# Patient Record
Sex: Male | Born: 1970 | Race: White | Hispanic: No | Marital: Married | Smoking: Former smoker
Health system: Southern US, Community
[De-identification: ages and names within clinical notes are randomized; demographics above are authoritative.]

## PROBLEM LIST (undated history)

## (undated) DIAGNOSIS — G4733 Obstructive sleep apnea (adult) (pediatric): Secondary | ICD-10-CM

## (undated) DIAGNOSIS — T7840XA Allergy, unspecified, initial encounter: Secondary | ICD-10-CM

## (undated) DIAGNOSIS — K9041 Non-celiac gluten sensitivity: Secondary | ICD-10-CM

## (undated) DIAGNOSIS — F419 Anxiety disorder, unspecified: Secondary | ICD-10-CM

## (undated) DIAGNOSIS — I10 Essential (primary) hypertension: Secondary | ICD-10-CM

## (undated) DIAGNOSIS — E039 Hypothyroidism, unspecified: Secondary | ICD-10-CM

## (undated) DIAGNOSIS — F39 Unspecified mood [affective] disorder: Secondary | ICD-10-CM

## (undated) DIAGNOSIS — E079 Disorder of thyroid, unspecified: Secondary | ICD-10-CM

## (undated) DIAGNOSIS — M159 Polyosteoarthritis, unspecified: Secondary | ICD-10-CM

## (undated) DIAGNOSIS — G4730 Sleep apnea, unspecified: Secondary | ICD-10-CM

## (undated) HISTORY — DX: Unspecified mood (affective) disorder: F39

## (undated) HISTORY — DX: Polyosteoarthritis, unspecified: M15.9

## (undated) HISTORY — DX: Hypothyroidism, unspecified: E03.9

## (undated) HISTORY — PX: EYE SURGERY: SHX253

## (undated) HISTORY — DX: Essential (primary) hypertension: I10

## (undated) HISTORY — DX: Anxiety disorder, unspecified: F41.9

## (undated) HISTORY — DX: Obstructive sleep apnea (adult) (pediatric): G47.33

## (undated) HISTORY — DX: Non-celiac gluten sensitivity: K90.41

## (undated) HISTORY — PX: NO PAST SURGERIES: SHX2092

## (undated) HISTORY — DX: Allergy, unspecified, initial encounter: T78.40XA

## (undated) HISTORY — DX: Sleep apnea, unspecified: G47.30

---

## 2011-05-04 ENCOUNTER — Encounter (HOSPITAL_COMMUNITY): Payer: Self-pay | Admitting: *Deleted

## 2011-05-04 ENCOUNTER — Emergency Department (HOSPITAL_COMMUNITY)
Admission: EM | Admit: 2011-05-04 | Discharge: 2011-05-04 | Disposition: A | Discharge status: Home / Self Care | Payer: Managed Care, Other (non HMO) | Attending: Emergency Medicine | Admitting: Emergency Medicine

## 2011-05-04 ENCOUNTER — Emergency Department (HOSPITAL_COMMUNITY): Payer: Managed Care, Other (non HMO)

## 2011-05-04 DIAGNOSIS — F17200 Nicotine dependence, unspecified, uncomplicated: Secondary | ICD-10-CM | POA: Insufficient documentation

## 2011-05-04 DIAGNOSIS — R209 Unspecified disturbances of skin sensation: Secondary | ICD-10-CM | POA: Insufficient documentation

## 2011-05-04 DIAGNOSIS — W3189XA Contact with other specified machinery, initial encounter: Secondary | ICD-10-CM | POA: Insufficient documentation

## 2011-05-04 DIAGNOSIS — S61209A Unspecified open wound of unspecified finger without damage to nail, initial encounter: Secondary | ICD-10-CM | POA: Insufficient documentation

## 2011-05-04 DIAGNOSIS — S62639B Displaced fracture of distal phalanx of unspecified finger, initial encounter for open fracture: Secondary | ICD-10-CM | POA: Insufficient documentation

## 2011-05-04 DIAGNOSIS — IMO0002 Reserved for concepts with insufficient information to code with codable children: Secondary | ICD-10-CM

## 2011-05-04 MED ORDER — TETANUS-DIPHTH-ACELL PERTUSSIS 5-2.5-18.5 LF-MCG/0.5 IM SUSP
0.5000 mL | Freq: Once | INTRAMUSCULAR | Status: DC
  Filled 2011-05-04: qty 0.5

## 2011-05-04 MED ORDER — OXYCODONE-ACETAMINOPHEN 5-325 MG PO TABS
1.0000 | ORAL_TABLET | Freq: Once | ORAL | Status: AC
  Administered 2011-05-04: 1 via ORAL

## 2011-05-04 MED ORDER — OXYCODONE-ACETAMINOPHEN 5-325 MG PO TABS
ORAL_TABLET | ORAL | Status: AC
  Filled 2011-05-04: qty 1

## 2011-05-04 MED ORDER — OXYCODONE HCL 5 MG PO CAPS: 10.0000 mg | ORAL_CAPSULE | ORAL | Status: AC | PRN

## 2011-05-04 MED ORDER — CEPHALEXIN 500 MG PO CAPS: 500.0000 mg | ORAL_CAPSULE | Freq: Four times a day (QID) | ORAL | Status: AC

## 2011-05-04 NOTE — Consult Note (Signed)
  Dict # [id number]  See Op Note  Dominica Severin MD

## 2011-05-04 NOTE — ED Notes (Signed)
The pt caught rt index finger in a wood splitter just pta.  The pt has a laceration to the base of his finger nail. And the fingernail base is outside the skin .  Bleeding is controlled

## 2011-05-04 NOTE — ED Provider Notes (Signed)
History     CSN: [id number]  Arrival date & time 05/04/11  1821   First MD Initiated Contact with Patient 05/04/11 2126      Chief Complaint  Patient presents with  . Laceration    (Consider location/radiation/quality/duration/timing/severity/associated sxs/prior treatment) HPI History from patient. 41 year old male presents with a laceration to his right index finger. He was cutting some wood in a long splitter, when his glove became caught and his hand went forward toward the machine. He was immediately able to turn the machine off. He did sustain a laceration horizontally across his distal phalanx of his index finger with this. He denies any numbness, weakness, but does notice some slight tingling. Denies inability to move the finger.  History reviewed. No pertinent past medical history.  History reviewed. No pertinent past surgical history.  No family history on file.  History  Substance Use Topics  . Smoking status: Current Everyday Smoker  . Smokeless tobacco: Not on file  . Alcohol Use: Yes      Review of Systems  Constitutional: Negative.   Musculoskeletal: Positive for arthralgias. Negative for joint swelling.  Skin: Positive for wound.  Neurological: Negative for weakness and numbness.    Allergies  Sulfa antibiotics  Home Medications   Current Outpatient Rx  Name Route Sig Dispense Refill  . CETIRIZINE HCL 10 MG PO TABS Oral Take 10 mg by mouth daily.    Marland Kitchen FLUCONAZOLE 200 MG PO TABS Oral Take 200 mg by mouth every 7 (seven) days. 1 tab weekly on Mondays for 6 months; Start date 04/25/11    . LEVOTHYROXINE SODIUM 125 MCG PO TABS Oral Take 125 mcg by mouth daily.      BP 128/83  Pulse 88  Temp(Src) 98.1 F (36.7 C) (Oral)  Resp 18  SpO2 98%  Physical Exam  Nursing note and vitals reviewed. Constitutional: He appears well-developed and well-nourished. No distress.  HENT:  Head: Normocephalic and atraumatic.  Neck: Normal range of motion.    Cardiovascular: Normal rate.   Pulmonary/Chest: Effort normal.  Musculoskeletal: Normal range of motion.       Horizontal laceration at the eponychium, measuring approximately 1 to 1.5 centimeters in length, extending outward from the sides of the nail bilaterally. There is no injury to the pad of the finger. The nail is not notably loose, but there is a large subungual hematoma. Neurovascularly intact with sensory intact to light touch. Patient is able to flex and extend at the isolated DIP.  Neurological: He is alert.  Skin: Skin is warm and dry. He is not diaphoretic.  Psychiatric: He has a normal mood and affect.    ED Course  Procedures (including critical care time)  Labs Reviewed - No data to display Dg Finger Index Right  05/04/2011  *RADIOLOGY REPORT*  Clinical Data: Finger laceration  RIGHT INDEX FINGER 2+V  Comparison: None  Findings: There is a fracture deformity involving the tuft of the distal phalanx.  There is distal and volar displacement of the distal fracture fragment.  There is diffuse soft tissue swelling.  IMPRESSION:  1.  Fracture involves the tuft of the distal phalanx. 2.  Volar and distal displacement of the distal fracture fragments.  Original Report Authenticated By: Rosealee Albee, M.D.     1. Open fracture of distal phalangeal tuft       MDM  Case d/w Dr. Amanda Pea, on call for hand surgery, as the patient has a laceration with a tuft fx, which is  considered an open fx. He graciously agreed to repair the patient's injury in the ED. Pt tolerated well. He is to follow up with Dr. Amanda Pea as instructed. He was discharged with oxycodone and Keflex prescriptions. Return precautions discussed.        Grant Fontana, [place] 05/05/11 [phone]

## 2011-05-04 NOTE — Discharge Instructions (Signed)
We recommend that you to take vitamin C 1000 mg a day to promote healing we also recommend that if you require her pain medicine that he take a stool softener to prevent constipation as most pain medicines will have constipation side effects. We recommend either Peri-Colace or Senokot and recommend that you also consider adding MiraLAX to prevent the constipation affects from pain medicine if you are required to use them. These medicines are over the counter and maybe purchased at a local pharmacy. ° ° ° °Keep bandage clean and dry.  Call for any problems.  No smoking.  Criteria for driving a car: you should be off your pain medicine for 7-8 hours, able to drive one handed(confident), thinking clearly and feeling able in your judgement to drive. °Continue elevation as it will decrease swelling.  If instructed by MD move your fingers within the confines of the bandage/splint.  Use ice if instructed by your MD. Call immediately for any sudden loss of feeling in your hand/arm or change in functional abilities of the extremity. ° °

## 2011-05-04 NOTE — Consult Note (Signed)
Reason for Consult open right index fracture Referring Physician: ED staff  Gregory [name] is an 41 y.o. male.  HPI: Marland KitchenMarland KitchenPatient presents for evaluation and treatment of the of their upper extremity predicament. The patient denies neck back chest or of abdominal pain. The patient notes that they have no lower extremity problems. The patient from primarily complains of the upper extremity pain noted.  Patient presents for right index reconstruction after wood spliting injury  History reviewed. No pertinent past medical history.  History reviewed. No pertinent past surgical history.  No family history on file.  Social History:  reports that he has been smoking.  He does not have any smokeless tobacco history on file. He reports that he drinks alcohol. His drug history not on file.  Allergies:  Allergies  Allergen Reactions  . Sulfa Antibiotics     Medications: I have reviewed the patient's current medications.  No results found for this or any previous visit (from the past 48 hour(s)).  Dg Finger Index Right  05/04/2011  *RADIOLOGY REPORT*  Clinical Data: Finger laceration  RIGHT INDEX FINGER 2+V  Comparison: None  Findings: There is a fracture deformity involving the tuft of the distal phalanx.  There is distal and volar displacement of the distal fracture fragment.  There is diffuse soft tissue swelling.  IMPRESSION:  1.  Fracture involves the tuft of the distal phalanx. 2.  Volar and distal displacement of the distal fracture fragments.  Original Report Authenticated By: Rosealee Albee, M.D.    Review of Systems  Constitutional: Negative.   HENT: Negative.   Eyes: Negative.   Respiratory: Negative.   Cardiovascular: Negative.   Gastrointestinal: Negative.   Genitourinary: Negative.   Musculoskeletal:       See operative note re: index finger right hand  Skin: Negative.   Neurological: Negative.   Endo/Heme/Allergies: Negative.   Psychiatric/Behavioral: Negative.     Blood pressure 128/83, pulse 88, temperature 98.1 F (36.7 C), temperature source Oral, resp. rate 18, SpO2 98.00%. Physical Exam..The patient is alert and oriented in no acute distress the patient complains of pain in the affected upper extremity. The patient is noted to have a normal HEENT exam. Lung fields show equal chest expansion and no shortness of breath abdomen exam is nontender without distention. Lower extremity examination does not show any fracture dislocation or blood clot symptoms. Pelvis is stable neck and back are stable and nontender  Open fracture of the right index finger with nailbed injury and laceration  Assessment/Plan: Marland KitchenMarland KitchenWe are planning surgery for your upper extremity. The risk and benefits of surgery include risk of bleeding infection anesthesia damage to normal structures and failure of the surgery to accomplish its intended goals of relieving symptoms and restoring function with this in mind we'll going to proceed. I have specifically discussed with the patient the pre-and postoperative regime and the does and don'ts and risk and benefits in great detail. Risk and benefits of surgery also include risk of dystrophy chronic nerve pain failure of the healing process to go onto completion and other inherent risks of surgery The relavent the pathophysiology of the disease/injury process, as well as the alternatives for treatment and postoperative course of action has been discussed in great detail with the patient who desires to proceed.  We will do everything in our power to help you (the patient) restore function to the upper extremity. Is a pleasure to see this patient today.   Karen Chafe 05/04/2011, 11:04 PM

## 2011-05-05 NOTE — Op Note (Signed)
NAME:  Gregory [name], Gregory [name] NO.:  [id number]  MEDICAL RECORD NO.:  [id number]  LOCATION:  [id number]                        FACILITY:  [facility]  PHYSICIAN:  Dionne Ano. Telecia Larocque, M.D.DATE OF BIRTH:  05-10-1970  DATE OF PROCEDURE: DATE OF DISCHARGE:                              OPERATIVE REPORT   PREOPERATIVE DIAGNOSIS:  Open fracture, right index finger with nail plate and nail bed, distal ray.  POSTOPERATIVE DIAGNOSIS:  Open fracture, right index finger with nail plate and nail bed, distal ray.  PROCEDURES: 1. Irrigation and debridement, open fracture.  This was an excisional     debridement of skin, subcutaneous tissue, bone, and nail bed and     nail plate. 2. Nail plate removal. 3. Open treatment of distal phalanx fracture. 4. Complex nail bed laceration repair including germinal and sterile     matrix.  SURGEON:  Dionne Ano. Amanda Pea, MD  ASSISTANT:  None.  COMPLICATIONS:  None.  INDICATIONS FOR THE PROCEDURE:  The patient presents after wood splitting injury with an open fracture of the right index finger.  He understands the risks and benefits of surgery and desires to proceed.  PROCEDURE:  The patient was seen by myself.  He was given intermetacarpal block.  Prepped and draped in usual sterile fashion with Hibiclens washing followed by 2 separate Betadine scrubs.  Once this was done, sterile field was secured, and the patient had the nail plate removed.  Nail plate removal was accomplished without difficulty.  Following this, 2 L of saline were placed in a pulsatile fashion through the open fracture.  This was an irrigation and debridement of an open fracture, excisional in nature including skin, subcutaneous tissue, bone, and nail bed tissue.  Following I and D, the patient then underwent very careful and cautious look at the fracture.  The patient underwent a setting and open treatment of the distal phalanx fracture.  Once distal phalanx fracture was  treated with open setting technique, the patient then underwent a very careful and cautious nail bed repair utilizing 4.0 loupe magnification and 6-0 chromic.  The patient had the sterile and germinal matrix repaired without difficulty.  He tolerated this well.  Once this was done, tourniquet was deflated.  Refill was excellent.  Adaptic was placed under the eponychial fold to prevent nail bed adherence, and the patient then had sterile noncompressive bandage place followed by finger splint.  He was instructed on elevation, antibiotics in the form of Keflex, pain medicine in the form of oxycodone, vitamin C, Peri-Colace, and our standard postop regime.  We will see him in a week in therapy for finger splint, and following this, I will see him in 2 weeks in my office for followup exam and treatment.  Should any problems occur,  he will notify me.  Do's and do not's have been discussed and all questions encouraged and answered.     Dionne Ano. Amanda Pea, M.D.     [facility]  D:  05/04/2011  T:  05/05/2011  Job:  [id number]

## 2011-05-07 NOTE — ED Provider Notes (Signed)
Medical screening examination/treatment/procedure(s) were performed by non-physician practitioner and as supervising physician I was immediately available for consultation/collaboration.  Leeroy Lovings, MD 05/07/11 1556 

## 2016-02-10 LAB — HM COLONOSCOPY

## 2017-02-25 ENCOUNTER — Other Ambulatory Visit: Payer: Self-pay

## 2017-02-25 ENCOUNTER — Encounter: Payer: Self-pay | Admitting: Emergency Medicine

## 2017-02-25 ENCOUNTER — Emergency Department
Admission: EM | Admit: 2017-02-25 | Discharge: 2017-02-26 | Disposition: A | Discharge status: Home / Self Care | Payer: 59 | Attending: Emergency Medicine | Admitting: Emergency Medicine

## 2017-02-25 DIAGNOSIS — J039 Acute tonsillitis, unspecified: Secondary | ICD-10-CM | POA: Diagnosis not present

## 2017-02-25 DIAGNOSIS — Z79899 Other long term (current) drug therapy: Secondary | ICD-10-CM | POA: Insufficient documentation

## 2017-02-25 DIAGNOSIS — R070 Pain in throat: Secondary | ICD-10-CM | POA: Diagnosis present

## 2017-02-25 HISTORY — DX: Disorder of thyroid, unspecified: E07.9

## 2017-02-25 MED ORDER — SODIUM CHLORIDE 0.9 % IV BOLUS (SEPSIS)
1000.0000 mL | INTRAVENOUS | Status: AC
  Administered 2017-02-25: 1000 mL via INTRAVENOUS

## 2017-02-25 NOTE — ED Notes (Signed)
Pt dx with strep today at urgent care .  Pt has had 2 doses today of pcn.  States not feeling any better.  Pt spitting up phlegm.  No resp distress.   Decreased appetite.  Pt alert.

## 2017-02-25 NOTE — ED Notes (Signed)
ED Provider at bedside. 

## 2017-02-25 NOTE — ED Triage Notes (Addendum)
Pt presents to ED with severe sore throat. Unable to swallow his secretions. Dx with strep today at urgent care. Wife states pt voice sounds more muffled than normal.  Pt states earlier this evening around 2100 he coughed up a silver dollar size amount of blood. Has been unable to eat in two days.

## 2017-02-26 ENCOUNTER — Emergency Department: Payer: 59

## 2017-02-26 ENCOUNTER — Encounter: Payer: Self-pay | Admitting: Radiology

## 2017-02-26 LAB — BASIC METABOLIC PANEL
Anion gap: 11 (ref 5–15)
BUN: 15 mg/dL (ref 6–20)
CALCIUM: 8.8 mg/dL — AB (ref 8.9–10.3)
CO2: 25 mmol/L (ref 22–32)
CREATININE: 0.91 mg/dL (ref 0.61–1.24)
Chloride: 104 mmol/L (ref 101–111)
GFR calc non Af Amer: >60 mL/min (ref >60)
Glucose, Bld: 103 mg/dL — ABNORMAL HIGH (ref 65–99)
Potassium: 4 mmol/L (ref 3.5–5.1)
Sodium: 140 mmol/L (ref 135–145)

## 2017-02-26 LAB — CBC WITH DIFFERENTIAL/PLATELET
Basophils Absolute: 0 10*3/uL (ref 0–0.1)
Basophils Relative: 0 %
EOS ABS: 0 10*3/uL (ref 0–0.7)
Eosinophils Relative: 0 %
HCT: 46.4 % (ref 40.0–52.0)
Hemoglobin: 15.9 g/dL (ref 13.0–18.0)
Lymphocytes Relative: 11 %
Lymphs Abs: 1.6 10*3/uL (ref 1.0–3.6)
MCH: 31.4 pg (ref 26.0–34.0)
MCHC: 34.2 g/dL (ref 32.0–36.0)
MCV: 91.7 fL (ref 80.0–100.0)
MONO ABS: 1.2 10*3/uL — AB (ref 0.2–1.0)
MONOS PCT: 8 %
NEUTROS ABS: 11.7 10*3/uL — AB (ref 1.4–6.5)
Neutrophils Relative %: 81 %
Platelets: 238 10*3/uL (ref 150–440)
RBC: 5.06 MIL/uL (ref 4.40–5.90)
RDW: 13 % (ref 11.5–14.5)
WBC: 14.6 10*3/uL — ABNORMAL HIGH (ref 3.8–10.6)

## 2017-02-26 MED ORDER — SODIUM CHLORIDE 0.9 % IV SOLN
3.0000 g | INTRAVENOUS | Status: AC
  Administered 2017-02-26: 3 g via INTRAVENOUS
  Filled 2017-02-26: qty 3

## 2017-02-26 MED ORDER — IOPAMIDOL (ISOVUE-300) INJECTION 61%
75.0000 mL | Freq: Once | INTRAVENOUS | Status: AC | PRN
  Administered 2017-02-26: 75 mL via INTRAVENOUS

## 2017-02-26 MED ORDER — DEXAMETHASONE SODIUM PHOSPHATE 10 MG/ML IJ SOLN
10.0000 mg | Freq: Once | INTRAMUSCULAR | Status: AC
  Administered 2017-02-26: 10 mg via INTRAVENOUS
  Filled 2017-02-26: qty 1

## 2017-02-26 NOTE — ED Notes (Signed)
ED Provider at bedside. 

## 2017-02-26 NOTE — ED Provider Notes (Signed)
[facility] Emergency Department Provider Note  ____________________________________________   First MD Initiated Contact with Patient 02/25/17 2335     (approximate)  I have reviewed the triage vital signs and the nursing notes.   HISTORY  Chief Complaint Sore Throat    HPI Gregory [name] is a 47 y.o. male with no contributory past medical history who presents for evaluation of gradually worsening severe sore throat over the last several days.  It is gotten to the point that it is very difficult to swallow anything and his wife feels like his voice is hoarse.  Due to the pain he has been spitting out his saliva and he feels like there is some swelling in the left side of his throat.  He does not have any dental problems at this time.  He was diagnosed with strep throat at urgent care earlier today and has had 2 doses of penicillin but he is not feeling any better.  He is not having any respiratory distress but he has had decreased appetite.  Denies fever/chills, nausea, vomiting, abdominal pain, and dysuria.  He describes the symptoms as severe trying to eat or drink anything makes it worse, nothing makes it better.  He did cough or spit up a small amount of blood today.  Past Medical History:  Diagnosis Date  . Thyroid disease     There are no active problems to display for this patient.   History reviewed. No pertinent surgical history.  Prior to Admission medications   Medication Sig Start Date End Date Taking? Authorizing Provider  cetirizine (ZYRTEC) 10 MG tablet Take 10 mg by mouth daily.    [provider]  levothyroxine (SYNTHROID, LEVOTHROID) 125 MCG tablet Take 125 mcg by mouth daily.    [provider]  penicillin v potassium (VEETID) 500 MG tablet Take 500 mg by mouth 2 (two) times daily. 02/25/17   [provider]    Allergies Sulfasalazine and Sulfa antibiotics  No family history on file.  Social  History Social History   Tobacco Use  . Smoking status: Never Smoker  . Smokeless tobacco: Never Used  Substance Use Topics  . Alcohol use: Yes  . Drug use: No    Review of Systems Constitutional: No fever/chills Eyes: No visual changes. ENT: Severe sore throat with a sensation of swelling and difficulty swallowing Cardiovascular: Denies chest pain. Respiratory: Denies shortness of breath. Gastrointestinal: No abdominal pain.  No nausea, no vomiting.  No diarrhea.  No constipation. Genitourinary: Negative for dysuria. Musculoskeletal: Negative for neck pain.  Negative for back pain. Integumentary: Negative for rash. Neurological: Negative for headaches, focal weakness or numbness.   ____________________________________________   PHYSICAL EXAM:  VITAL SIGNS: ED Triage Vitals  Enc Vitals Group     BP 02/25/17 2304 (!) 139/95     Pulse Rate 02/25/17 2304 75     Resp 02/25/17 2304 20     Temp 02/25/17 2304 98.6 F (37 C)     Temp Source 02/25/17 2304 Oral     SpO2 02/25/17 2304 97 %     Weight 02/25/17 2305 98 kg (216 lb)     Height 02/25/17 2305 1.803 m (5\' 11" )     Head Circumference --      Peak Flow --      Pain Score 02/25/17 2305 8     Pain Loc --      Pain Edu? --      Excl. in GC? --  Constitutional: Alert and oriented.  Nontoxic but does appear uncomfortable  eyes: Conjunctivae are normal.  Head: Atraumatic. Nose: No congestion/rhinnorhea. Mouth/Throat: Mucous membranes are moist.  Oropharynx images with some petechiae on the palate but no exudate and no obvious swelling.  No evidence of Ludwig's angina, no obvious sign of peritonsillar abscess, no evidence of dental abscesses or swelling. Neck: No stridor.  No meningeal signs.  No submandibular lymphadenopathy or induration and no palpable cervical lymphadenopathy Cardiovascular: Normal rate, regular rhythm. Good peripheral circulation. Grossly normal heart sounds. Respiratory: Normal respiratory  effort.  No retractions. Lungs CTAB. Gastrointestinal: Soft and nontender. No distention.  Musculoskeletal: No lower extremity tenderness nor edema. No gross deformities of extremities. Neurologic:  Normal speech and language. No gross focal neurologic deficits are appreciated.  Skin:  Skin is warm, dry and intact. No rash noted. Psychiatric: Mood and affect are normal. Speech and behavior are normal.  ____________________________________________   LABS (all labs ordered are listed, but only abnormal results are displayed)  Labs Reviewed  CBC WITH DIFFERENTIAL/PLATELET - Abnormal; Notable for the following components:      Result Value   WBC 14.6 (*)    Neutro Abs 11.7 (*)    Monocytes Absolute 1.2 (*)    All other components within normal limits  BASIC METABOLIC PANEL - Abnormal; Notable for the following components:   Glucose, Bld 103 (*)    Calcium 8.8 (*)    All other components within normal limits   ____________________________________________  EKG  None - EKG not ordered by ED physician ____________________________________________  RADIOLOGY   ED MD interpretation: No evidence of acute abscess or neck soft tissue infection other than tonsillitis  Official radiology report(s): Ct Soft Tissue Neck W Contrast  Result Date: 02/26/2017 CLINICAL DATA:  Sore throat and stridor EXAM: CT NECK WITH CONTRAST TECHNIQUE: Multidetector CT imaging of the neck was performed using the standard protocol following the bolus administration of intravenous contrast. CONTRAST:  75mL ISOVUE-300 IOPAMIDOL (ISOVUE-300) INJECTION 61% COMPARISON:  None. FINDINGS: Pharynx and larynx: --Nasopharynx: Fossae of Rosenmuller are clear. Normal adenoid tonsils for age. --Oral cavity and oropharynx: Marked enlargement of the palatine and lingual tonsils. No peritonsillar fluid collection. Oral cavity and base of tongue are normal. --Hypopharynx: Normal vallecula and pyriform sinuses. --Larynx: Normal  epiglottis and pre-epiglottic space. Normal aryepiglottic and vocal folds. --Retropharyngeal space: No abscess, effusion or lymphadenopathy. Salivary glands: --Parotid: No mass lesion or inflammation. No sialolithiasis or ductal dilatation. --Submandibular: Symmetric without inflammation. No sialolithiasis or ductal dilatation. --Sublingual: Normal. No ranula or other visible lesion of the base of tongue and floor of mouth. Thyroid: Normal. Lymph nodes: 12 mm left level 2 A lymph node. 11 mm right level 2 A node. No abnormal density nodes. Vascular: Major cervical vessels are patent. Limited intracranial: Normal. Visualized orbits: Normal. Mastoids and visualized paranasal sinuses: Right maxillary sinus fluid level with mucosal thickening. No mastoid or middle ear effusion. Skeleton: No bony spinal canal stenosis. No lytic or blastic lesions. Upper chest: Clear. Other: None. IMPRESSION: 1. Enlarged palatine and lingual tonsils, consistent with acute tonsillopharyngitis. No peritonsillar or retropharyngeal abscess or fluid collection. 2. Bilateral reactive level 2 A cervical lymphadenopathy. 3. Normal epiglottis.  No airway compromise. Electronically Signed   By: Deatra RobinsonKevin  Herman M.D.   On: 02/26/2017 00:58    ____________________________________________   PROCEDURES  Critical Care performed: No   Procedure(s) performed:   Procedures   ____________________________________________   INITIAL IMPRESSION / ASSESSMENT AND PLAN / ED COURSE  As part of my medical decision making, I reviewed the following data within the electronic MEDICAL RECORD NUMBER Nursing notes reviewed and incorporated and Labs reviewed     Differential diagnosis includes, but is not limited to, tonsillitis/pharyngitis, peritonsillar abscess, retropharyngeal abscess, epiglottitis, suppurative adenitis.  Given the patient's description of symptoms I will proceed with a CT scan of his neck given that he has no obvious peritonsillar  abscess and I am concerned about a deep tissue infection.  I will give Unasyn 3 g IV and Decadron 10 mg IV to start and then I will reassess after the imaging.  He is protecting his airway well at this time and does not look systemically ill.  Clinical Course as of Feb 26 329  Tue Feb 26, 2017  0154 I updated the patient regarding the reassuring CT scan results; he has tonsillitis/pharyngitis but no evidence of fluid collection or abscess.  There is no laryngeal/airway edema.  He is now tolerating p.o. intake.  Will discharge on his penicilline regimen.  He received Unasyn and Decadron in the ED, which should help.  [CF]    Clinical Course User Index [CF] Loleta Rose, MD    ____________________________________________  FINAL CLINICAL IMPRESSION(S) / ED DIAGNOSES  Final diagnoses:  Tonsillitis     MEDICATIONS GIVEN DURING THIS VISIT:  Medications  sodium chloride 0.9 % bolus 1,000 mL (0 mLs Intravenous Stopped 02/26/17 0201)  iopamidol (ISOVUE-300) 61 % injection 75 mL (75 mLs Intravenous Contrast Given 02/26/17 0032)  Ampicillin-Sulbactam (UNASYN) 3 g in sodium chloride 0.9 % 100 mL IVPB (0 g Intravenous Stopped 02/26/17 0201)  dexamethasone (DECADRON) injection 10 mg (10 mg Intravenous Given 02/26/17 0042)     ED Discharge Orders    None       Note:  This document was prepared using Dragon voice recognition software and may include unintentional dictation errors.    Loleta Rose, MD 02/26/17 [phone]

## 2017-02-26 NOTE — Discharge Instructions (Signed)
Please continue to take your penicillin twice daily for the full 10-day course.  Follow-up with your doctor or with the ENT specialist as needed.  Return to the emergency department if you develop new or worsening symptoms that concern you.

## 2017-04-15 ENCOUNTER — Encounter (HOSPITAL_BASED_OUTPATIENT_CLINIC_OR_DEPARTMENT_OTHER): Payer: Self-pay

## 2017-04-15 DIAGNOSIS — R0683 Snoring: Secondary | ICD-10-CM

## 2017-05-09 ENCOUNTER — Ambulatory Visit (HOSPITAL_BASED_OUTPATIENT_CLINIC_OR_DEPARTMENT_OTHER): Payer: Managed Care, Other (non HMO) | Attending: Otolaryngology | Admitting: Internal Medicine

## 2017-05-09 DIAGNOSIS — G4733 Obstructive sleep apnea (adult) (pediatric): Secondary | ICD-10-CM | POA: Diagnosis not present

## 2017-05-09 DIAGNOSIS — R0683 Snoring: Secondary | ICD-10-CM | POA: Diagnosis present

## 2017-05-19 DIAGNOSIS — R0683 Snoring: Secondary | ICD-10-CM | POA: Diagnosis not present

## 2017-05-19 NOTE — Procedures (Signed)
Patient Name: Gregory [name], Gregory [name] Date: 05/09/2017 Gender: Male D.O.B: 1970/05/23 Age (years): 46 Referring Provider: Izora Gala Height (inches): 71 Interpreting Physician: Baird Lyons MD, ABSM Weight (lbs): 215 RPSGT: Lanae Boast BMI: 30 MRN: [id number] Neck Size: 16.50  CLINICAL INFORMATION Sleep Study Type: Split Night CPAP Indication for sleep study: Snoring  Epworth Sleepiness Score: 7  SLEEP STUDY TECHNIQUE As per the AASM Manual for the Scoring of Sleep and Associated Events v2.3 (April 2016) with a hypopnea requiring 4% desaturations.  The channels recorded and monitored were frontal, central and occipital EEG, electrooculogram (EOG), submentalis EMG (chin), nasal and oral airflow, thoracic and abdominal wall motion, anterior tibialis EMG, snore microphone, electrocardiogram, and pulse oximetry. Continuous positive airway pressure (CPAP) was initiated when the patient met split night criteria and was titrated according to treat sleep-disordered breathing.  MEDICATIONS Medications self-administered by patient taken the night of the study : AMBIEN  RESPIRATORY PARAMETERS Diagnostic  Total AHI (/hr): 18.8 RDI (/hr): 22.7 OA Index (/hr): - CA Index (/hr): 0.0 REM AHI (/hr): 56.3 NREM AHI (/hr): 14.9 Supine AHI (/hr): 25.0 Non-supine AHI (/hr): 7.18 Min O2 Sat (%): 84.0 Mean O2 (%): 93.9 Time below 88% (min): 2.5   Titration  Optimal Pressure (cm): 9 AHI at Optimal Pressure (/hr): 0.0 Min O2 at Optimal Pressure (%): 93.0 Supine % at Optimal (%): 81 Sleep % at Optimal (%): 91   SLEEP ARCHITECTURE The recording time for the entire night was 390.9 minutes.  During a baseline period of 211.8 minutes, the patient slept for 169.0 minutes in REM and nonREM, yielding a sleep efficiency of 79.8%%. Sleep onset after lights out was 24.1 minutes with a REM latency of 139.5 minutes. The patient spent 16.0%% of the night in stage N1 sleep, 73.1%% in stage N2  sleep, 1.5%% in stage N3 and 9.5%% in REM.  During the titration period of 177.2 minutes, the patient slept for 170.8 minutes in REM and nonREM, yielding a sleep efficiency of 96.4%%. Sleep onset after CPAP initiation was 0.4 minutes with a REM latency of 61.5 minutes. The patient spent 4.3%% of the night in stage N1 sleep, 77.3%% in stage N2 sleep, 0.0%% in stage N3 and 18.4%% in REM.  CARDIAC DATA The 2 lead EKG demonstrated sinus rhythm. The mean heart rate was 100.0 beats per minute. Other EKG findings include: None.  LEG MOVEMENT DATA The total Periodic Limb Movements of Sleep (PLMS) were 0. The PLMS index was 0.0 .  IMPRESSIONS - Moderate obstructive sleep apnea occurred during the diagnostic portion of the study(AHI = 18.8/hour). An optimal PAP pressure was selected for this patient ( 9 cm of water) - No significant central sleep apnea occurred during the diagnostic portion of the study (CAI = 0.0/hour). - The patient had minimal or no oxygen desaturation during the diagnostic portion of the study (Min O2 = 84.0%) - The patient snored with loud snoring volume during the diagnostic portion of the study. - No cardiac abnormalities were noted during this study. - Clinically significant periodic limb movements did not occur during sleep.  DIAGNOSIS - Obstructive Sleep Apnea (327.23 [G47.33 ICD-10])  RECOMMENDATIONS - Trial of CPAP therapy on 9 cm H2O with a Medium size Resmed Full Face Mask AirFit F20 mask and heated humidification. - Sleep hygiene should be reviewed to assess factors that may improve sleep quality. - Weight management and regular exercise should be initiated or continued.  [Electronically signed] 05/19/2017 02:46 PM  Baird Lyons MD, ABSM Diplomate,  American Board of Sleep Medicine   NPI: [id number]                         [place]  ELECTRONICALLY SIGNED ON:  05/19/2017, 2:45 PM [place] PH: [phone]   FX: [phone] [place]

## 2018-10-29 ENCOUNTER — Encounter: Payer: Self-pay | Admitting: Internal Medicine

## 2018-10-29 ENCOUNTER — Other Ambulatory Visit: Payer: Self-pay

## 2018-10-29 ENCOUNTER — Ambulatory Visit (INDEPENDENT_AMBULATORY_CARE_PROVIDER_SITE_OTHER): Payer: 59 | Admitting: Internal Medicine

## 2018-10-29 VITALS — BP 122/86 | HR 73 | Temp 98.6°F | Ht 70.0 in | Wt 247.0 lb

## 2018-10-29 DIAGNOSIS — F39 Unspecified mood [affective] disorder: Secondary | ICD-10-CM | POA: Diagnosis not present

## 2018-10-29 DIAGNOSIS — E039 Hypothyroidism, unspecified: Secondary | ICD-10-CM | POA: Diagnosis not present

## 2018-10-29 DIAGNOSIS — M159 Polyosteoarthritis, unspecified: Secondary | ICD-10-CM | POA: Insufficient documentation

## 2018-10-29 DIAGNOSIS — I10 Essential (primary) hypertension: Secondary | ICD-10-CM | POA: Diagnosis not present

## 2018-10-29 DIAGNOSIS — Z0000 Encounter for general adult medical examination without abnormal findings: Secondary | ICD-10-CM | POA: Insufficient documentation

## 2018-10-29 DIAGNOSIS — Z23 Encounter for immunization: Secondary | ICD-10-CM

## 2018-10-29 DIAGNOSIS — G4733 Obstructive sleep apnea (adult) (pediatric): Secondary | ICD-10-CM | POA: Insufficient documentation

## 2018-10-29 DIAGNOSIS — M153 Secondary multiple arthritis: Secondary | ICD-10-CM

## 2018-10-29 MED ORDER — TRIAMTERENE-HCTZ 37.5-25 MG PO TABS: 1.0000 | ORAL_TABLET | Freq: Every day | ORAL | 3 refills | Status: DC

## 2018-10-29 MED ORDER — ESCITALOPRAM OXALATE 10 MG PO TABS: 10.0000 mg | ORAL_TABLET | Freq: Every day | ORAL | 3 refills | Status: DC

## 2018-10-29 MED ORDER — LEVOTHYROXINE SODIUM 125 MCG PO TABS: 125.0000 ug | ORAL_TABLET | Freq: Every day | ORAL | 3 refills | Status: DC

## 2018-10-29 NOTE — Patient Instructions (Signed)

## 2018-10-29 NOTE — Assessment & Plan Note (Signed)
Seems to be euthyroid Will check labs next time

## 2018-10-29 NOTE — Assessment & Plan Note (Signed)
He will work on fitness Flu vaccine today Will need colon again in 2023 No PSA for some years

## 2018-10-29 NOTE — Progress Notes (Signed)
Subjective:    Patient ID: Gregory [name], male    DOB: November 25, 1970, 48 y.o.   MRN: [id number]  HPI Here to establish care and for physical Had been going to his prior primary care doctor in [place]--but tired of the travel  Has had some BP problems Average on his wrist cuff is 144/86 He wants to be aggressive about this--would like to try medication  Started lexapro earlier this year Stress with work and family This has helped keep him more level---- not having anxiety attacks, etc No depression or dysthymia Not anhedonic No features of bipolar Uses xanax just for flying  Now 50# overweight but has been very fit in the past Limited by ankle injuries and knee pain (trouble walking) Has done cross fit in the past Does like bourbon--has increased intake as well (no alcohol dependence issues)  Uncle had colon cancer He already has had his first colonoscopy Concerned about gluten sensitivity---did lose weight when he went gluten free  Rx for hypothyroidism Goes back 10 years  Current Outpatient Medications on File Prior to Visit  Medication Sig Dispense Refill  . ALPRAZolam (XANAX) 0.25 MG tablet Take 0.25 mg by mouth 2 (two) times daily as needed.    Marland Kitchen escitalopram (LEXAPRO) 10 MG tablet Take 10 mg by mouth daily.    . fluticasone (FLONASE) 50 MCG/ACT nasal spray Place 2 sprays into both nostrils daily.    Marland Kitchen levothyroxine (SYNTHROID, LEVOTHROID) 125 MCG tablet Take 125 mcg by mouth daily.    . Melatonin 5 MG CHEW Chew by mouth.    . Probiotic Product (PROBIOTIC PO) Take by mouth.     No current facility-administered medications on file prior to visit.     Allergies  Allergen Reactions  . Sulfasalazine Anaphylaxis and Rash  . Gluten Meal   . Sulfa Antibiotics     Past Medical History:  Diagnosis Date  . Hypertension   . Hypothyroidism, adult   . Mood disorder ([place])   . Non-celiac gluten sensitivity   . Obstructive sleep apnea    Better with CPAP  .  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 NO PAST SURGERIES      History reviewed. No pertinent family history.  Social History   Socioeconomic History  . Marital status: Married    Spouse name: Not on file  . Number of children: 3  . Years of education: Not on file  . Highest education level: Not on file  Occupational History  . 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 Financial resource strain: Not on file  . Food insecurity    Worry: Not on file    Inability: Not on file  . Transportation needs    Medical: Not on file    Non-medical: Not on file  Tobacco Use  . Smoking status: Current Some Day Smoker    Types: Cigars  . Smokeless tobacco: Never Used  . Tobacco comment: 1 cigar a week or so  Substance and Sexual Activity  . Alcohol use: Yes  . Drug use: No  . Sexual activity: Not on file  Lifestyle  . Physical activity    Days per week: Not on file    Minutes per session: Not on file  . Stress: Not on file  Relationships  . Social connections    Talks on phone: Not on file  Gets together: Not on file    Attends religious service: Not on file    Active member of club or organization: Not on file    Attends meetings of clubs or organizations: Not on file    Relationship status: Not on file  . Intimate partner violence    Fear of current or ex partner: Not on file    Emotionally abused: Not on file    Physically abused: Not on file    Forced sexual activity: Not on file  Other Topics Concern  . Not on file  Social History Narrative  . Not on file   Review of Systems  Constitutional: Positive for unexpected weight change. Negative for fatigue.       Wears seat belt  HENT: Negative for dental problem and hearing loss.   Eyes: Negative for visual disturbance.       No diplopia or unilateral vision loss  Respiratory: Positive for chest  tightness. Negative for cough and shortness of breath.   Cardiovascular: Positive for palpitations.       Occ chest tightness when stressed---relates to "panic" type issues  Gastrointestinal:       Stools are generally soft or runny No blood  Endocrine: Negative for polydipsia and polyuria.  Genitourinary: Negative for difficulty urinating and urgency.  Musculoskeletal: Positive for arthralgias. Negative for joint swelling.  Allergic/Immunologic: Positive for environmental allergies. Negative for immunocompromised state.       Flonase helps congestion  Neurological: Negative for dizziness, syncope and light-headedness.  Psychiatric/Behavioral: Negative for dysphoric mood. The patient is nervous/anxious.        Sleeps okay with CPAP and CBD/melatonin (has weaned off ambien)       Objective:   Physical Exam  Constitutional: He is oriented to person, place, and time. He appears well-developed. No distress.  HENT:  Mouth/Throat: Oropharynx is clear and moist. No oropharyngeal exudate.  Eyes: Conjunctivae are normal.  Neck: No thyromegaly present.  Cardiovascular: Normal rate, regular rhythm, normal heart sounds and intact distal pulses. Exam reveals no gallop.  No murmur heard. Respiratory: Effort normal and breath sounds normal. No respiratory distress. He has no wheezes. He has no rales.  GI: Soft. There is no abdominal tenderness.  Musculoskeletal:        General: No tenderness or edema.  Lymphadenopathy:    He has no cervical adenopathy.  Neurological: He is alert and oriented to person, place, and time.  Skin: No rash noted. No erythema.  Psychiatric: He has a normal mood and affect. His behavior is normal.           Assessment & Plan:

## 2018-10-29 NOTE — Assessment & Plan Note (Signed)
Mostly anxiety and sleep problems

## 2018-10-29 NOTE — Assessment & Plan Note (Signed)
BP Readings from Last 3 Encounters:  10/29/18 122/86  02/25/17 140/90  05/04/11 135/89   He is concerned and wants to be proactive about the BP Will start HCTZ

## 2018-12-09 ENCOUNTER — Ambulatory Visit: Payer: 59 | Admitting: Internal Medicine

## 2018-12-19 ENCOUNTER — Ambulatory Visit: Payer: 59 | Admitting: Internal Medicine

## 2018-12-23 ENCOUNTER — Other Ambulatory Visit: Payer: Self-pay

## 2018-12-23 ENCOUNTER — Encounter: Payer: Self-pay | Admitting: Internal Medicine

## 2018-12-23 ENCOUNTER — Ambulatory Visit: Payer: 59 | Admitting: Internal Medicine

## 2018-12-23 DIAGNOSIS — I10 Essential (primary) hypertension: Secondary | ICD-10-CM

## 2018-12-23 LAB — CBC
HCT: 48.8 % (ref 39.0–52.0)
Hemoglobin: 17 g/dL (ref 13.0–17.0)
MCHC: 34.8 g/dL (ref 30.0–36.0)
MCV: 93.5 fl (ref 78.0–100.0)
Platelets: 267 10*3/uL (ref 150.0–400.0)
RBC: 5.22 Mil/uL (ref 4.22–5.81)
RDW: 12.5 % (ref 11.5–15.5)
WBC: 7.9 10*3/uL (ref 4.0–10.5)

## 2018-12-23 LAB — COMPREHENSIVE METABOLIC PANEL
ALT: 29 U/L (ref 0–53)
AST: 20 U/L (ref 0–37)
Albumin: 4.7 g/dL (ref 3.5–5.2)
Alkaline Phosphatase: 73 U/L (ref 39–117)
BUN: 16 mg/dL (ref 6–23)
CO2: 33 mEq/L — ABNORMAL HIGH (ref 19–32)
Calcium: 10 mg/dL (ref 8.4–10.5)
Chloride: 98 mEq/L (ref 96–112)
Creatinine, Ser: 1.14 mg/dL (ref 0.40–1.50)
GFR: 68.81 mL/min (ref >60.00)
Glucose, Bld: 109 mg/dL — ABNORMAL HIGH (ref 70–99)
Potassium: 4.4 mEq/L (ref 3.5–5.1)
Sodium: 137 mEq/L (ref 135–145)
Total Bilirubin: 0.8 mg/dL (ref 0.2–1.2)
Total Protein: 7.3 g/dL (ref 6.0–8.3)

## 2018-12-23 LAB — LIPID PANEL
Cholesterol: 205 mg/dL — ABNORMAL HIGH (ref 0–200)
HDL: 46.9 mg/dL (ref >39.00)
NonHDL: 158.08
Total CHOL/HDL Ratio: 4
Triglycerides: 254 mg/dL — ABNORMAL HIGH (ref 0.0–149.0)
VLDL: 50.8 mg/dL — ABNORMAL HIGH (ref 0.0–40.0)

## 2018-12-23 LAB — LDL CHOLESTEROL, DIRECT: Direct LDL: 116 mg/dL

## 2018-12-23 NOTE — Assessment & Plan Note (Signed)
BP Readings from Last 3 Encounters:  12/23/18 110/74  10/29/18 122/86  02/25/17 140/90   Doing well with the medication Will check renal function

## 2018-12-23 NOTE — Progress Notes (Signed)
Subjective:    Patient ID: Gregory [name], male    DOB: 1970-03-01, 48 y.o.   MRN: [id number]  HPI Here for follow up of HTN  This visit occurred during the SARS-CoV-2 public health emergency.  Safety protocols were in place, including screening questions prior to the visit, additional usage of staff PPE, and extensive cleaning of exam room while observing appropriate contact time as indicated for disinfecting solutions.   Has been taking the medication regularly No problems with this He feels "less of a pressure in my body" Describes it as "systemic fullness"---leading to short temper, frustration, etc Feels more calm, etc  Got arm cuff to check BP Hasn't been monitoring it lately  Current Outpatient Medications on File Prior to Visit  Medication Sig Dispense Refill  . ALPRAZolam (XANAX) 0.25 MG tablet Take 0.25 mg by mouth 2 (two) times daily as needed.    Marland Kitchen escitalopram (LEXAPRO) 10 MG tablet Take 1 tablet (10 mg total) by mouth daily. 90 tablet 3  . fluticasone (FLONASE) 50 MCG/ACT nasal spray Place 2 sprays into both nostrils daily.    Marland Kitchen levothyroxine (SYNTHROID) 125 MCG tablet Take 1 tablet (125 mcg total) by mouth daily. 90 tablet 3  . Melatonin 5 MG CHEW Chew by mouth.    . Probiotic Product (PROBIOTIC PO) Take by mouth.    . triamterene-hydrochlorothiazide (MAXZIDE-25) 37.5-25 MG tablet Take 1 tablet by mouth daily. 90 tablet 3   No current facility-administered medications on file prior to visit.    Allergies  Allergen Reactions  . Sulfasalazine Anaphylaxis and Rash  . Gluten Meal   . Sulfa Antibiotics     Past Medical History:  Diagnosis Date  . Hypertension   . Hypothyroidism, adult   . Mood disorder ([facility])   . Non-celiac gluten sensitivity   . Obstructive sleep apnea    Better with CPAP  . 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 NO PAST SURGERIES      Family History  Problem  Relation Age of Onset  . Heart disease Mother        valve repair  . Diabetes Father   . Obesity Father   . Colon cancer Paternal Uncle   . Diabetes Paternal Grandmother     Social History   Socioeconomic History  . Marital status: Married    Spouse name: Not on file  . Number of children: 3  . Years of education: Not on file  . Highest education level: Not on file  Occupational History  . 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  Tobacco Use  . Smoking status: Light Tobacco Smoker    Types: Cigars  . Smokeless tobacco: Never Used  . Tobacco comment: 1 every 1-2 months  Substance and Sexual Activity  . Alcohol use: Yes  . Drug use: No  . Sexual activity: Not on file  Other Topics Concern  . Not on file  Social History Narrative  . Not on file   Social Determinants of Health   Financial Resource Strain:   . Difficulty of Paying Living Expenses: Not on file  Food Insecurity:   . Worried About Programme researcher, broadcasting/film/video in the Last Year: Not on file  . Ran Out of Food in the Last Year: Not on file  Transportation Needs:   . Lack of Transportation (Medical): Not on file  . Lack of Transportation (Non-Medical):  Not on file  Physical Activity:   . Days of Exercise per Week: Not on file  . Minutes of Exercise per Session: Not on file  Stress:   . Feeling of Stress : Not on file  Social Connections:   . Frequency of Communication with Friends and Family: Not on file  . Frequency of Social Gatherings with Friends and Family: Not on file  . Attends Religious Services: Not on file  . Active Member of Clubs or Organizations: Not on file  . Attends Archivist Meetings: Not on file  . Marital Status: Not on file  Intimate Partner Violence:   . Fear of Current or Ex-Partner: Not on file  . Emotionally Abused: Not on file  . Physically Abused: Not on file  . Sexually Abused: Not on file   Review of Systems  Notes increased urination after  taking the BP med Sleeps great--with CPAP and melatonin Appetite is okay---weight down slightly     Objective:   Physical Exam  Constitutional: He appears well-developed. No distress.  Neck: No thyromegaly present.  Cardiovascular: Normal rate, regular rhythm and normal heart sounds. Exam reveals no gallop.  No murmur heard. Respiratory: Effort normal and breath sounds normal. No respiratory distress. He has no wheezes. He has no rales.  Lymphadenopathy:    He has no cervical adenopathy.  Psychiatric: He has a normal mood and affect. His behavior is normal.           Assessment & Plan:

## 2019-02-09 ENCOUNTER — Telehealth: Payer: Self-pay | Admitting: Internal Medicine

## 2019-02-09 NOTE — Telephone Encounter (Signed)
I left a message for patient to return my call. 

## 2019-06-02 ENCOUNTER — Other Ambulatory Visit: Payer: Self-pay

## 2019-06-02 ENCOUNTER — Ambulatory Visit: Payer: 59 | Attending: Internal Medicine

## 2019-06-02 DIAGNOSIS — Z20822 Contact with and (suspected) exposure to covid-19: Secondary | ICD-10-CM

## 2019-06-03 LAB — SARS-COV-2, NAA 2 DAY TAT

## 2019-06-03 LAB — NOVEL CORONAVIRUS, NAA: SARS-CoV-2, NAA: DETECTED — AB

## 2019-06-04 ENCOUNTER — Telehealth: Payer: Self-pay | Admitting: Physician Assistant

## 2019-06-04 ENCOUNTER — Other Ambulatory Visit: Payer: Self-pay | Admitting: Physician Assistant

## 2019-06-04 DIAGNOSIS — U071 COVID-19: Secondary | ICD-10-CM

## 2019-06-04 DIAGNOSIS — I10 Essential (primary) hypertension: Secondary | ICD-10-CM

## 2019-06-04 MED ORDER — SODIUM CHLORIDE 0.9 % IV SOLN
Freq: Once | INTRAVENOUS | Status: AC
  Filled 2019-06-04: qty 20

## 2019-06-04 NOTE — Telephone Encounter (Signed)
I called patient to discuss infusion treatment. Onset of symptoms 5/25. Patient is pharmaceutical Rap and has taken off label Ivermactine. He will call us if intrusted getting MAB. Call back number provided. Hx of HTN and BMI of at least 30. May need to review with Dr.Wright if interested as he has taken off label drug but I don't see any contraindications.

## 2019-06-04 NOTE — Progress Notes (Signed)
  I connected by phone with [name] on 06/04/2019 at 6:01 PM to discuss the potential use of an new treatment for mild to moderate COVID-19 viral infection in non-hospitalized patients.  This patient is a 49 y.o. male that meets the FDA criteria for Emergency Use Authorization of bamlanivimab/etesevimab or casirivimab/imdevimab.  Has a (+) direct SARS-CoV-2 viral test result  Has mild or moderate COVID-19   Is NOT hospitalized due to COVID-19  Is within 10 days of symptom onset  Has at least one of the high risk factor(s) for progression to severe COVID-19 and/or hospitalization as defined in EUA.  Specific high risk criteria : BMI > 25   Hx of HTN   I have spoken and communicated the following to the patient or parent/caregiver:  1. FDA has authorized the emergency use of bamlanivimab/etesevimab and casirivimab\imdevimab for the treatment of mild to moderate COVID-19 in adults and pediatric patients with positive results of direct SARS-CoV-2 viral testing who are 58 years of age and older weighing at least 40 kg, and who are at high risk for progressing to severe COVID-19 and/or hospitalization.  2. The significant known and potential risks and benefits of bamlanivimab/etesevimab and casirivimab\imdevimab, and the extent to which such potential risks and benefits are unknown.  3. Information on available alternative treatments and the risks and benefits of those alternatives, including clinical trials.  4. Patients treated with bamlanivimab/etesevimab and casirivimab\imdevimab should continue to self-isolate and use infection control measures (e.g., wear mask, isolate, social distance, avoid sharing personal items, clean and disinfect "high touch" surfaces, and frequent handwashing) according to CDC guidelines.   5. The patient or parent/caregiver has the option to accept or refuse bamlanivimab/etesevimab or casirivimab\imdevimab .  After reviewing this information with the  patient, The patient agreed to proceed with receiving the bamlanimivab infusion and will be provided a copy of the Fact sheet prior to receiving the infusion.Sharrell Ku Omarii Scalzo 06/04/2019 6:01 PM

## 2019-06-05 ENCOUNTER — Ambulatory Visit (HOSPITAL_COMMUNITY)
Admission: RE | Admit: 2019-06-05 | Discharge: 2019-06-05 | Disposition: A | Discharge status: Home / Self Care | Payer: 59 | Source: Ambulatory Visit | Attending: Pulmonary Disease | Admitting: Pulmonary Disease

## 2019-06-05 DIAGNOSIS — U071 COVID-19: Secondary | ICD-10-CM | POA: Insufficient documentation

## 2019-06-05 MED ORDER — SODIUM CHLORIDE 0.9 % IV SOLN: INTRAVENOUS | Status: DC | PRN

## 2019-06-05 MED ORDER — EPINEPHRINE 0.3 MG/0.3ML IJ SOAJ: 0.3000 mg | Freq: Once | INTRAMUSCULAR | Status: DC | PRN

## 2019-06-05 MED ORDER — METHYLPREDNISOLONE SODIUM SUCC 125 MG IJ SOLR: 125.0000 mg | Freq: Once | INTRAMUSCULAR | Status: DC | PRN

## 2019-06-05 MED ORDER — DIPHENHYDRAMINE HCL 50 MG/ML IJ SOLN: 50.0000 mg | Freq: Once | INTRAMUSCULAR | Status: DC | PRN

## 2019-06-05 MED ORDER — ACETAMINOPHEN 325 MG PO TABS
650.0000 mg | ORAL_TABLET | Freq: Once | ORAL | Status: AC
  Administered 2019-06-05: 650 mg via ORAL
  Filled 2019-06-05: qty 2

## 2019-06-05 MED ORDER — ALBUTEROL SULFATE HFA 108 (90 BASE) MCG/ACT IN AERS: 2.0000 | INHALATION_SPRAY | Freq: Once | RESPIRATORY_TRACT | Status: DC | PRN

## 2019-06-05 MED ORDER — FAMOTIDINE IN NACL 20-0.9 MG/50ML-% IV SOLN: 20.0000 mg | Freq: Once | INTRAVENOUS | Status: DC | PRN

## 2019-06-05 NOTE — Progress Notes (Signed)
  Diagnosis: COVID-19  Physician:Dr. Delford Field  Procedure: [facility] Med: bamlanivimab\etesevimab infusion - Provided patient with bamlanimivab\etesevimab fact sheet for patients, parents and caregivers prior to infusion.  Complications: No immediate complications noted.   Discharge: Discharged home   Nevin Bloodgood 06/05/2019

## 2019-06-05 NOTE — Discharge Instructions (Signed)

## 2019-06-22 ENCOUNTER — Telehealth: Payer: Self-pay

## 2019-06-22 NOTE — Telephone Encounter (Signed)
There is a MyChart message to Dr Alphonsus Sias from the pt. I have not called him recently.

## 2019-06-22 NOTE — Telephone Encounter (Signed)
Form printed from his MyChart message and signed. Find out how he wants to get it

## 2019-06-22 NOTE — Telephone Encounter (Signed)
Pt left v/m requesting cb from [facility] CMA. On 06/05/19 pt had monoclonal antibody infusion after contracting covid 19. Last resulted + covid on 06/03/19. Pt's Short term disability ends on 06/24/19 and pt is to return to work on 06/25/19 and needs a fitness of duty certification form signed. Pt feels great and ready to return to work per v/m. Pt request cb from [facility] CMA.

## 2019-06-22 NOTE — Telephone Encounter (Signed)
Irving Burton scanned it in and sent him a new MyChart message with it attached. We are hoping that works. It is a new feature on the Epic upgrade.

## 2019-10-25 ENCOUNTER — Other Ambulatory Visit: Payer: Self-pay | Admitting: Internal Medicine

## 2019-11-12 IMAGING — CT CT NECK W/ CM
3 of 5 series · 10 of 33 positions shown, 12 images · IV contrast (iopamidol)
Comparison: None.

CLINICAL DATA: Sore throat and stridor

EXAM:
CT NECK WITH CONTRAST
TECHNIQUE: Multidetector CT imaging of the neck was performed using the
standard protocol following the bolus administration of intravenous
contrast.
CONTRAST:  75mL RI1CWV-OAA IOPAMIDOL (RI1CWV-OAA) INJECTION 61%

[Series 6: sag neck · sagittal · 0.49mm/px · 5 of 76 slices shown, 6 images]
[im 26/76  bone]
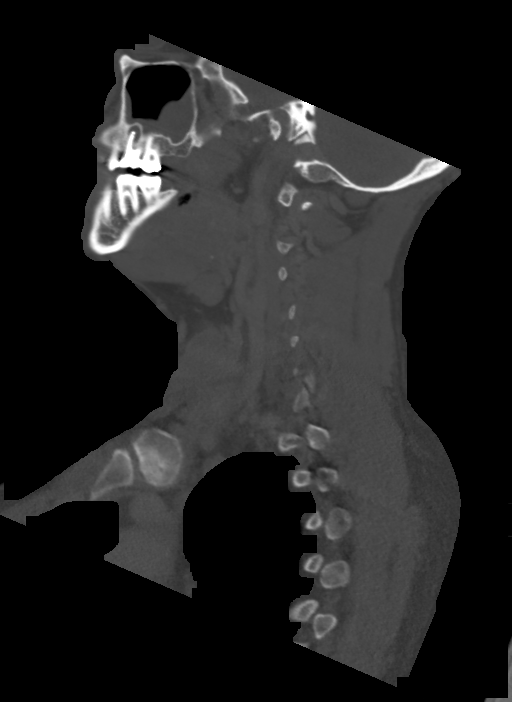
[im 32/76  bone]
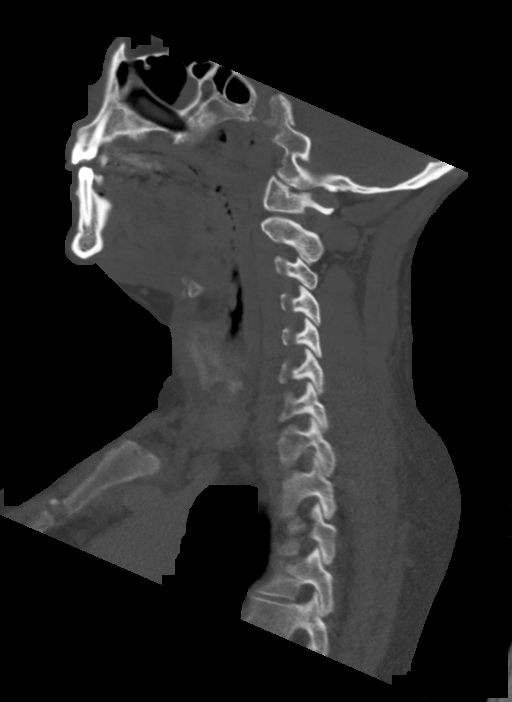
[im 38/76  soft-tissue]
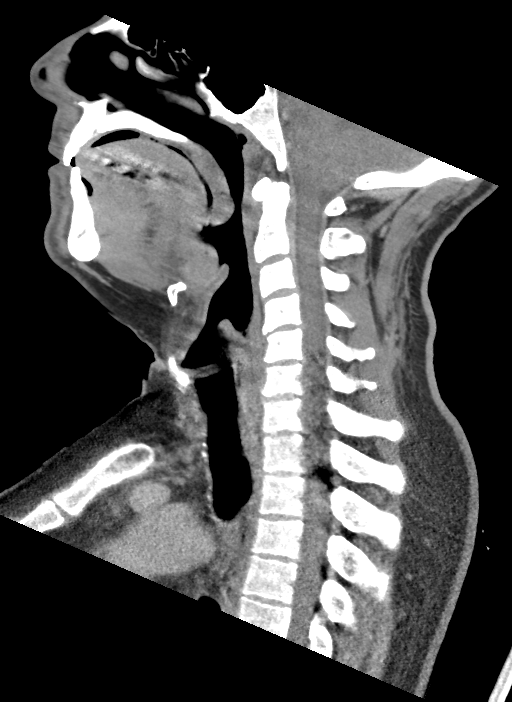
[im 38/76  bone]
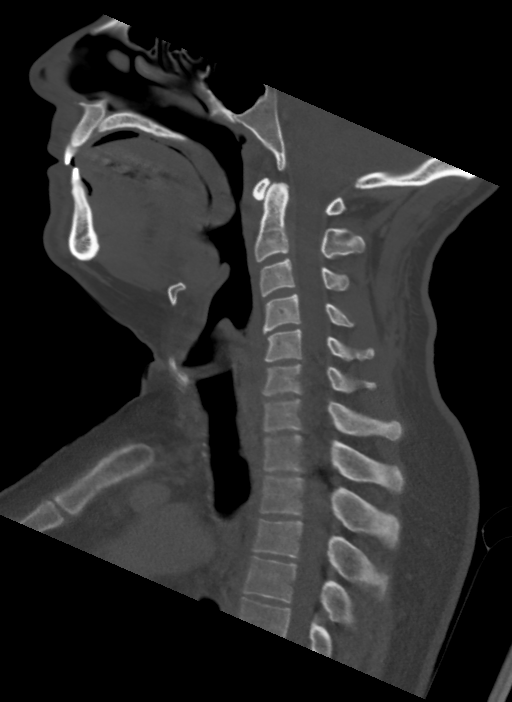
[im 44/76  bone]
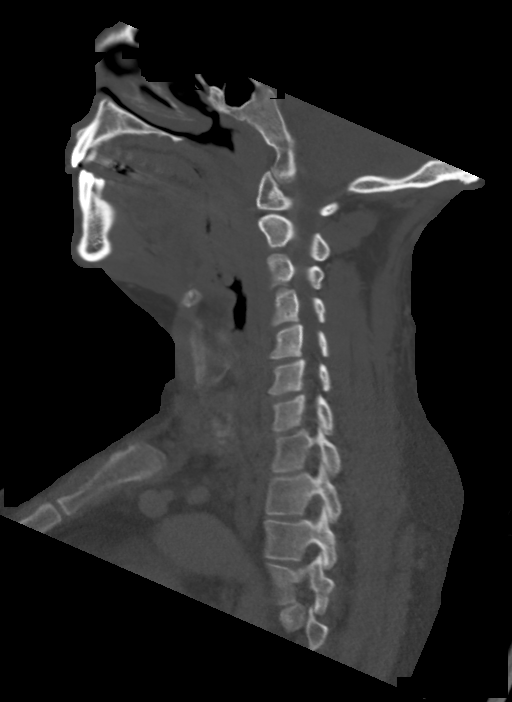
[im 51/76  bone]
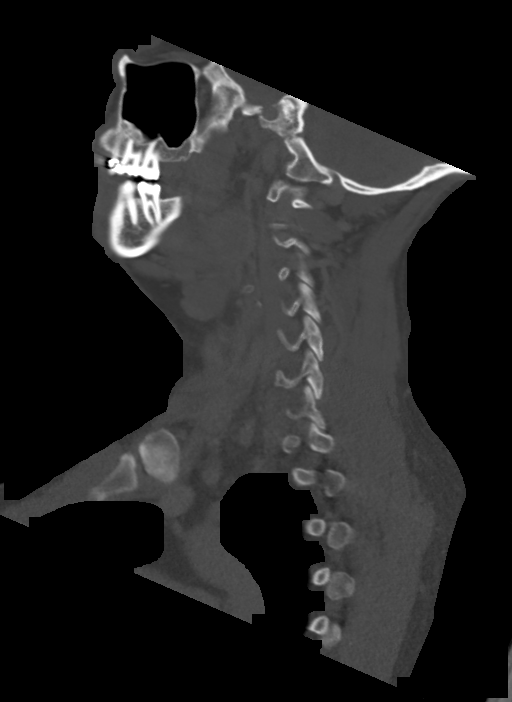

[Series 8: orthogonal ax · axial · 0.29mm/px · z∈[-299,-194]mm · 2 of 172 slices shown, 3 images]
[im 58/172  soft-tissue]
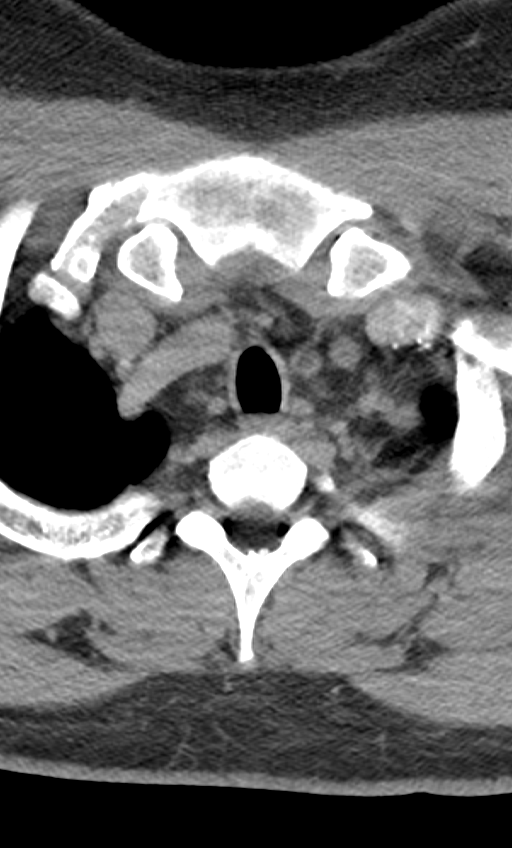
[im 58/172  bone]
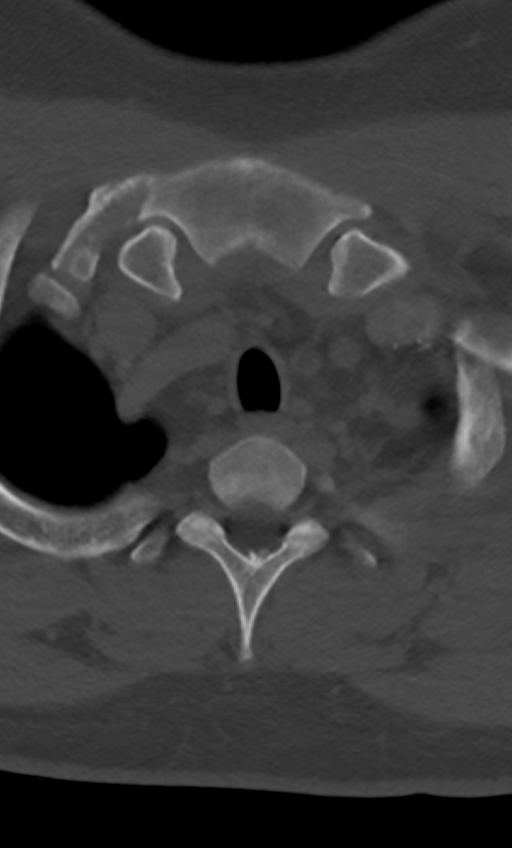
[im 115/172  bone]
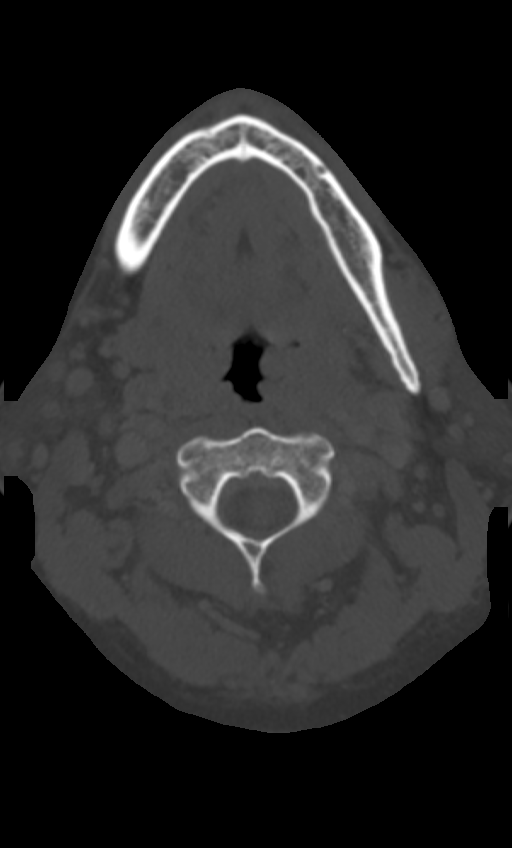

[Series 9: cor neck · coronal · 0.35mm/px · 3 of 126 slices shown]
[im 45/126  bone]
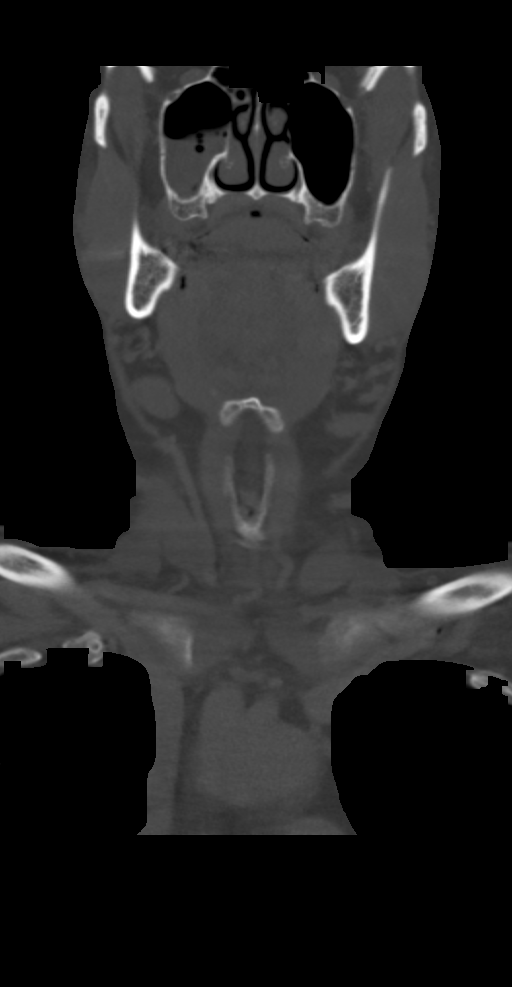
[im 57/126  bone]
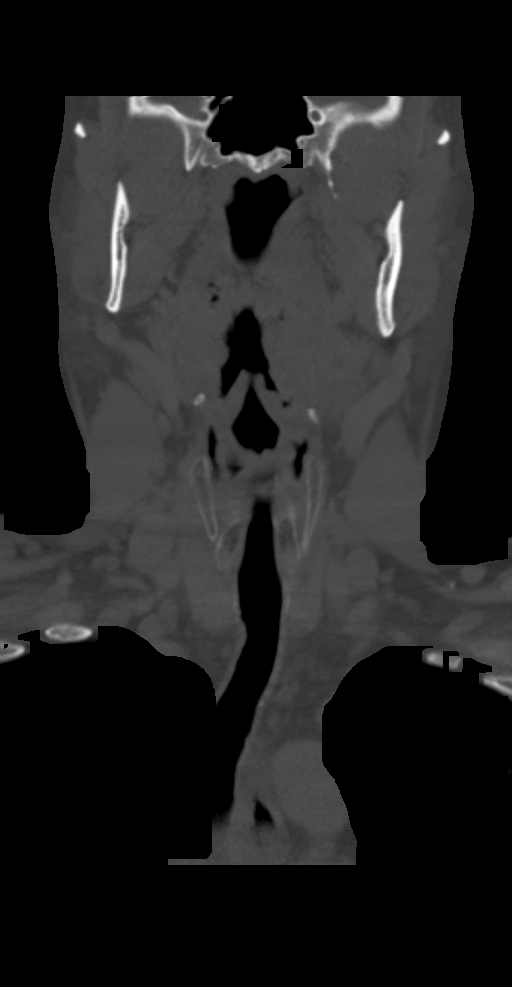
[im 69/126  bone]
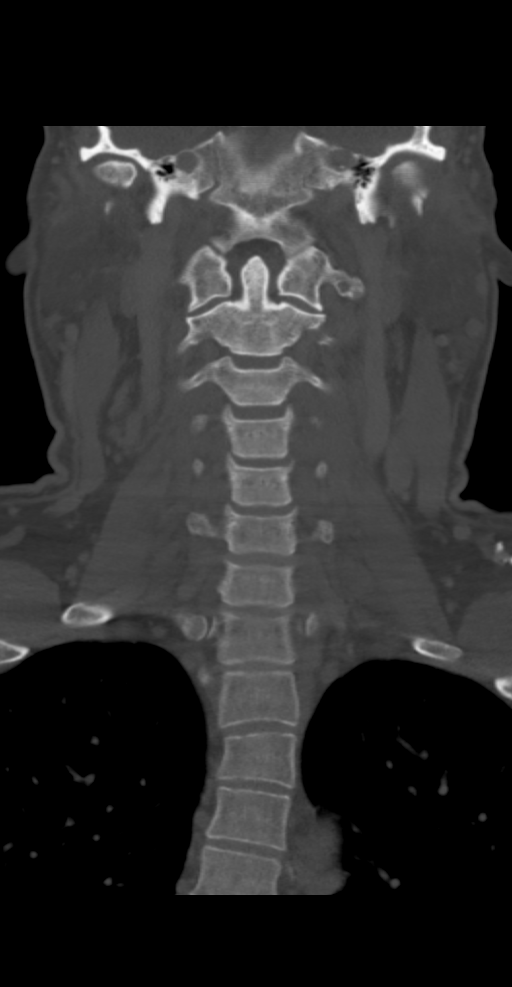

[10 of 33 positions shown; findings below may reference images not displayed]

FINDINGS: Pharynx and larynx:

--Nasopharynx: Fossae of Jaky are clear. Normal adenoid
tonsils for age.

--Oral cavity and oropharynx: Marked enlargement of the palatine and
lingual tonsils. No peritonsillar fluid collection. Oral cavity and
base of tongue are normal.

--Hypopharynx: Normal vallecula and pyriform sinuses.

--Larynx: Normal epiglottis and pre-epiglottic space. Normal
aryepiglottic and vocal folds.

--Retropharyngeal space: No abscess, effusion or lymphadenopathy.

Salivary glands:

--Parotid: No mass lesion or inflammation. No sialolithiasis or
ductal dilatation.

--Submandibular: Symmetric without inflammation. No sialolithiasis
or ductal dilatation.

--Sublingual: Normal. No ranula or other visible lesion of the base
of tongue and floor of mouth.

Thyroid: Normal.

Lymph nodes: 12 mm left level 2 A lymph node. 11 mm right level 2 A
node. No abnormal density nodes.

Vascular: Major cervical vessels are patent.

Limited intracranial: Normal.

Visualized orbits: Normal.

Mastoids and visualized paranasal sinuses: Right maxillary sinus
fluid level with mucosal thickening. No mastoid or middle ear
effusion.

Skeleton: No bony spinal canal stenosis. No lytic or blastic
lesions.

Upper chest: Clear.

Other: None.
IMPRESSION: 1. Enlarged palatine and lingual tonsils, consistent with acute
tonsillopharyngitis. No peritonsillar or retropharyngeal abscess or
fluid collection.
2. Bilateral reactive level 2 A cervical lymphadenopathy.
3. Normal epiglottis.  No airway compromise.

## 2019-12-25 ENCOUNTER — Ambulatory Visit (INDEPENDENT_AMBULATORY_CARE_PROVIDER_SITE_OTHER): Payer: 59 | Admitting: Internal Medicine

## 2019-12-25 ENCOUNTER — Other Ambulatory Visit: Payer: Self-pay

## 2019-12-25 ENCOUNTER — Encounter: Payer: Self-pay | Admitting: Internal Medicine

## 2019-12-25 VITALS — BP 106/82 | HR 57 | Temp 97.3°F | Ht 70.5 in | Wt 247.0 lb

## 2019-12-25 DIAGNOSIS — E039 Hypothyroidism, unspecified: Secondary | ICD-10-CM | POA: Diagnosis not present

## 2019-12-25 DIAGNOSIS — Z0000 Encounter for general adult medical examination without abnormal findings: Secondary | ICD-10-CM

## 2019-12-25 DIAGNOSIS — Z23 Encounter for immunization: Secondary | ICD-10-CM | POA: Diagnosis not present

## 2019-12-25 DIAGNOSIS — G4733 Obstructive sleep apnea (adult) (pediatric): Secondary | ICD-10-CM

## 2019-12-25 DIAGNOSIS — I10 Essential (primary) hypertension: Secondary | ICD-10-CM | POA: Diagnosis not present

## 2019-12-25 LAB — CBC
HCT: 46.7 % (ref 39.0–52.0)
Hemoglobin: 16.2 g/dL (ref 13.0–17.0)
MCHC: 34.7 g/dL (ref 30.0–36.0)
MCV: 92.5 fl (ref 78.0–100.0)
Platelets: 244 10*3/uL (ref 150.0–400.0)
RBC: 5.05 Mil/uL (ref 4.22–5.81)
RDW: 12.9 % (ref 11.5–15.5)
WBC: 7.3 10*3/uL (ref 4.0–10.5)

## 2019-12-25 LAB — TSH: TSH: 0.41 u[IU]/mL (ref 0.35–4.50)

## 2019-12-25 LAB — COMPREHENSIVE METABOLIC PANEL
ALT: 27 U/L (ref 0–53)
AST: 17 U/L (ref 0–37)
Albumin: 4.4 g/dL (ref 3.5–5.2)
Alkaline Phosphatase: 49 U/L (ref 39–117)
BUN: 16 mg/dL (ref 6–23)
CO2: 33 mEq/L — ABNORMAL HIGH (ref 19–32)
Calcium: 9.6 mg/dL (ref 8.4–10.5)
Chloride: 99 mEq/L (ref 96–112)
Creatinine, Ser: 1.16 mg/dL (ref 0.40–1.50)
GFR: 74.66 mL/min (ref >60.00)
Glucose, Bld: 103 mg/dL — ABNORMAL HIGH (ref 70–99)
Potassium: 4 mEq/L (ref 3.5–5.1)
Sodium: 140 mEq/L (ref 135–145)
Total Bilirubin: 1 mg/dL (ref 0.2–1.2)
Total Protein: 6.8 g/dL (ref 6.0–8.3)

## 2019-12-25 LAB — T4, FREE: Free T4: 0.73 ng/dL (ref 0.60–1.60)

## 2019-12-25 MED ORDER — ALPRAZOLAM 0.25 MG PO TABS: 0.2500 mg | ORAL_TABLET | Freq: Two times a day (BID) | ORAL | 0 refills | Status: DC | PRN

## 2019-12-25 MED ORDER — TRIAMTERENE-HCTZ 37.5-25 MG PO TABS: 1.0000 | ORAL_TABLET | Freq: Every day | ORAL | 3 refills | Status: DC

## 2019-12-25 MED ORDER — ESCITALOPRAM OXALATE 10 MG PO TABS: 10.0000 mg | ORAL_TABLET | Freq: Every day | ORAL | 3 refills | Status: DC

## 2019-12-25 MED ORDER — LEVOTHYROXINE SODIUM 125 MCG PO TABS: 125.0000 ug | ORAL_TABLET | Freq: Every day | ORAL | 3 refills | Status: DC

## 2019-12-25 NOTE — Progress Notes (Signed)
Subjective:    Patient ID: Gregory [name], male    DOB: 1970-02-25, 49 y.o.   MRN: [id number]  HPI Here for physical This visit occurred during the SARS-CoV-2 public health emergency.  Safety protocols were in place, including screening questions prior to the visit, additional usage of staff PPE, and extensive cleaning of exam room while observing appropriate contact time as indicated for disinfecting solutions.   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     escitalopram (LEXAPRO) 10 MG tablet TAKE 1 TABLET BY MOUTH EVERY DAY 90 tablet 0   fluticasone (FLONASE) 50 MCG/ACT nasal spray Place 2 sprays into both nostrils daily.     levothyroxine (SYNTHROID) 125 MCG tablet TAKE 1 TABLET BY MOUTH EVERY DAY 90 tablet 0   Melatonin 5 MG CHEW Chew by mouth.     Probiotic Product (PROBIOTIC PO) Take by mouth.     triamterene-hydrochlorothiazide (MAXZIDE-25) 37.5-25 MG tablet TAKE 1 TABLET BY MOUTH EVERY DAY 90 tablet 0   No current facility-administered medications on file prior to visit.    Allergies  Allergen Reactions   Sulfasalazine Anaphylaxis and Rash   Gluten Meal    Sulfa Antibiotics     Past Medical History:  Diagnosis Date   Hypertension    Hypothyroidism, adult    Mood disorder (HCC)    Non-celiac gluten sensitivity    Obstructive sleep apnea    Better with CPAP   Osteoarthritis of  multiple joints    knees and ankles    Past Surgical History:  Procedure Laterality Date   NO PAST SURGERIES      Family History  Problem Relation Age of Onset   Heart disease Mother        valve repair   Diabetes Father    Obesity Father    Colon cancer Paternal Uncle    Diabetes Paternal Grandmother     Social History   Socioeconomic History   Marital status: Married    Spouse name: Not on file   Number of children: 3   Years of education: Not on file   Highest education level: Not on file  Occupational History   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  Tobacco Use   Smoking status: Former Smoker    Types: Cigars   Smokeless tobacco: Never Used   Tobacco comment: 1 every 1-2 months  Vaping Use   Vaping Use: Some days  Substance and Sexual Activity   Alcohol use: Yes   Drug use: No   Sexual activity: Not on file  Other Topics Concern   Not on file  Social History Narrative   Not on file   Social Determinants of Health   Financial Resource Strain: Not on file  Food Insecurity: Not on file  Transportation Needs: Not on file  Physical Activity: Not on file  Stress: Not on file  Social  Connections: Not on file  Intimate Partner Violence: Not on file    Review of Systems  Constitutional:       Weight up 4# Fatigue resolved after COVID Wears seat belt  HENT: Negative for dental problem and hearing loss.        Occasional low pitched tinnitus in left ear Keeps up with dentist  Eyes: Negative for visual disturbance.       No diplopia or unilateral vision loss  Respiratory: Negative for cough, chest tightness and shortness of breath.   Cardiovascular: Negative for chest pain, palpitations and leg swelling.  Gastrointestinal: Negative for blood in stool.       Variable stool consistency No heartburn  Endocrine: Negative for polydipsia and polyuria.  Genitourinary: Negative for urgency.       Rare slow  stream  No sexual problems  Musculoskeletal: Negative for arthralgias and joint swelling.       Occ limited bouts of back pain since the fall  Skin: Negative for rash.  Allergic/Immunologic: Positive for environmental allergies. Negative for immunocompromised state.       No recent problems with this  Neurological: Negative for dizziness, syncope, light-headedness and headaches.       Had vertigo spell --caused fall from the ladder Some stress headaches--mild  Hematological: Negative for adenopathy. Does not bruise/bleed easily.  Psychiatric/Behavioral: Negative for dysphoric mood.       Sleeps okay with melatonin---off ambien       Objective:   Physical Exam Constitutional:      Appearance: Normal appearance.  HENT:     Mouth/Throat:     Pharynx: No oropharyngeal exudate or posterior oropharyngeal erythema.  Eyes:     Conjunctiva/sclera: Conjunctivae normal.     Pupils: Pupils are equal, round, and reactive to light.  Cardiovascular:     Rate and Rhythm: Normal rate and regular rhythm.     Pulses: Normal pulses.     Heart sounds: No murmur heard. No gallop.   Pulmonary:     Effort: Pulmonary effort is normal.     Breath sounds: Normal breath sounds. No wheezing or rales.  Abdominal:     Palpations: Abdomen is soft.     Tenderness: There is no abdominal tenderness.  Musculoskeletal:     Cervical back: Neck supple.     Right lower leg: No edema.     Left lower leg: No edema.  Lymphadenopathy:     Cervical: No cervical adenopathy.  Skin:    General: Skin is warm.     Findings: No rash.  Neurological:     General: No focal deficit present.     Mental Status: He is alert and oriented to person, place, and time.  Psychiatric:        Mood and Affect: Mood normal.        Behavior: Behavior normal.            Assessment & Plan:

## 2019-12-25 NOTE — Assessment & Plan Note (Signed)
He uses this every night and it works well

## 2019-12-25 NOTE — Assessment & Plan Note (Signed)
Healthy but needs to work on fitness Flu vaccine today COVID booster in a few months Colon in 2023 Too young for PSA

## 2019-12-25 NOTE — Assessment & Plan Note (Signed)
Seems euthyroid ?Will check labs ?

## 2019-12-25 NOTE — Addendum Note (Signed)
Addended by: Eual Fines on: 12/25/2019 01:06 PM   Modules accepted: Orders

## 2019-12-25 NOTE — Assessment & Plan Note (Signed)
BP Readings from Last 3 Encounters:  12/25/19 106/82  06/05/19 118/78  12/23/18 110/74   Doing well on the HCTZ Will check labs

## 2020-12-27 ENCOUNTER — Encounter: Payer: Self-pay | Admitting: Internal Medicine

## 2020-12-27 ENCOUNTER — Ambulatory Visit (INDEPENDENT_AMBULATORY_CARE_PROVIDER_SITE_OTHER): Payer: 59 | Admitting: Internal Medicine

## 2020-12-27 ENCOUNTER — Other Ambulatory Visit: Payer: Self-pay

## 2020-12-27 VITALS — BP 118/82 | HR 78 | Temp 97.4°F | Ht 70.5 in | Wt 251.0 lb

## 2020-12-27 DIAGNOSIS — E039 Hypothyroidism, unspecified: Secondary | ICD-10-CM

## 2020-12-27 DIAGNOSIS — I10 Essential (primary) hypertension: Secondary | ICD-10-CM

## 2020-12-27 DIAGNOSIS — G4733 Obstructive sleep apnea (adult) (pediatric): Secondary | ICD-10-CM

## 2020-12-27 DIAGNOSIS — F39 Unspecified mood [affective] disorder: Secondary | ICD-10-CM

## 2020-12-27 DIAGNOSIS — Z0000 Encounter for general adult medical examination without abnormal findings: Secondary | ICD-10-CM | POA: Diagnosis not present

## 2020-12-27 LAB — CBC
HCT: 49 % (ref 39.0–52.0)
Hemoglobin: 16.8 g/dL (ref 13.0–17.0)
MCHC: 34.3 g/dL (ref 30.0–36.0)
MCV: 93.7 fl (ref 78.0–100.0)
Platelets: 229 10*3/uL (ref 150.0–400.0)
RBC: 5.23 Mil/uL (ref 4.22–5.81)
RDW: 13.1 % (ref 11.5–15.5)
WBC: 6.8 10*3/uL (ref 4.0–10.5)

## 2020-12-27 LAB — COMPREHENSIVE METABOLIC PANEL
ALT: 33 U/L (ref 0–53)
AST: 23 U/L (ref 0–37)
Albumin: 4.3 g/dL (ref 3.5–5.2)
Alkaline Phosphatase: 51 U/L (ref 39–117)
BUN: 15 mg/dL (ref 6–23)
CO2: 29 mEq/L (ref 19–32)
Calcium: 9.5 mg/dL (ref 8.4–10.5)
Chloride: 101 mEq/L (ref 96–112)
Creatinine, Ser: 1.01 mg/dL (ref 0.40–1.50)
GFR: 87.53 mL/min (ref >60.00)
Glucose, Bld: 97 mg/dL (ref 70–99)
Potassium: 4.1 mEq/L (ref 3.5–5.1)
Sodium: 138 mEq/L (ref 135–145)
Total Bilirubin: 0.7 mg/dL (ref 0.2–1.2)
Total Protein: 6.8 g/dL (ref 6.0–8.3)

## 2020-12-27 LAB — T4, FREE: Free T4: 0.82 ng/dL (ref 0.60–1.60)

## 2020-12-27 LAB — TSH: TSH: 0.27 u[IU]/mL — ABNORMAL LOW (ref 0.35–5.50)

## 2020-12-27 MED ORDER — ALPRAZOLAM 0.25 MG PO TABS: 0.2500 mg | ORAL_TABLET | Freq: Two times a day (BID) | ORAL | 0 refills | Status: DC | PRN

## 2020-12-27 MED ORDER — LEVOTHYROXINE SODIUM 125 MCG PO TABS: 125.0000 ug | ORAL_TABLET | Freq: Every day | ORAL | 3 refills | Status: DC

## 2020-12-27 MED ORDER — TRIAMTERENE-HCTZ 37.5-25 MG PO TABS: 1.0000 | ORAL_TABLET | Freq: Every day | ORAL | 3 refills | Status: DC

## 2020-12-27 MED ORDER — ESCITALOPRAM OXALATE 10 MG PO TABS: 10.0000 mg | ORAL_TABLET | Freq: Every day | ORAL | 3 refills | Status: DC

## 2020-12-27 NOTE — Assessment & Plan Note (Signed)
Sleeps with CPAP every night 

## 2020-12-27 NOTE — Assessment & Plan Note (Signed)
Doing well with anxiety on the escitalopram Rarely uses the xanax (when flying)

## 2020-12-27 NOTE — Assessment & Plan Note (Signed)
Healthy but out of shape---discussed healthy eating and exercise Needs bivalent COVID vaccine Colonoscopy due again next February (may be deferred 2 more years with new guidelines--he will check) PSA at 60

## 2020-12-27 NOTE — Progress Notes (Signed)
Subjective:    Patient ID: Gregory [name], male    DOB: 06-Aug-1970, 50 y.o.   MRN: [id number]  HPI Here for physical  Knows he has to lose weight---otherwise he feels good Travelling more--promotion at job (mostly [place] to [employer]) Extra calories are food--no sweetened drinks Very little exercise  Going to ortho for knees and ankle pain Got hyaluronic acid injections in knees---it did help some  Current Outpatient Medications on File Prior to Visit  Medication Sig Dispense Refill   ALPRAZolam (XANAX) 0.25 MG tablet Take 1 tablet (0.25 mg total) by mouth 2 (two) times daily as needed. 30 tablet 0   escitalopram (LEXAPRO) 10 MG tablet Take 1 tablet (10 mg total) by mouth daily. 90 tablet 3   fluticasone (FLONASE) 50 MCG/ACT nasal spray Place 2 sprays into both nostrils daily.     levothyroxine (SYNTHROID) 125 MCG tablet Take 1 tablet (125 mcg total) by mouth daily. 90 tablet 3   Melatonin 5 MG CHEW Chew by mouth.     Probiotic Product (PROBIOTIC PO) Take by mouth.     triamterene-hydrochlorothiazide (MAXZIDE-25) 37.5-25 MG tablet Take 1 tablet by mouth daily. 90 tablet 3   No current facility-administered medications on file prior to visit.    Allergies  Allergen Reactions   Sulfasalazine Anaphylaxis and Rash   Gluten Meal    Sulfa Antibiotics     Past Medical History:  Diagnosis Date   Hypertension    Hypothyroidism, adult    Mood disorder (HCC)    Non-celiac gluten sensitivity    Obstructive sleep apnea    Better with CPAP   Osteoarthritis of multiple joints    knees and ankles    Past Surgical History:  Procedure Laterality Date   NO PAST SURGERIES      Family History  Problem Relation Age of Onset   Heart disease Mother        valve repair   Diabetes Father    Obesity Father    Colon cancer Paternal Uncle    Diabetes Paternal Grandmother     Social History   Socioeconomic History   Marital status: Married    Spouse name: Not on file    Number of children: 3   Years of education: Not on file   Highest education level: Not on file  Occupational History   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  Tobacco Use   Smoking status: Former    Types: Cigars   Smokeless tobacco: Never   Tobacco comments:    1 every 1-2 months  Vaping Use   Vaping Use: Some days  Substance and Sexual Activity   Alcohol use: Yes   Drug use: No   Sexual activity: Not on file  Other Topics Concern   Not on file  Social History Narrative   Not on file   Social Determinants of Health   Financial Resource Strain: Not on file  Food Insecurity: Not on file  Transportation Needs: Not on file  Physical Activity: Not on file  Stress: Not on file  Social Connections: Not on file  Intimate Partner Violence: Not on file   Review of Systems  Constitutional:  Negative for fatigue and unexpected weight change.  HENT:  Negative for tinnitus.        Recent ENT visit---cerumen cleared Hearing loss before that Keeps up with dentist  Eyes:  Negative for visual disturbance.       No diplopia or unilateral vision  loss  Respiratory:  Negative for cough and shortness of breath.   Cardiovascular:        Occ chest pain---just with stress. Resolves with a deep breath (within a minute). Occ racing heart  Gastrointestinal:  Negative for blood in stool.       Uses probiotic Does have bowel issues if lots of carbs No sig heartburn  Endocrine: Negative for polydipsia and polyuria.  Genitourinary:  Negative for urgency.       Very slight dribbling No sexual problems  Musculoskeletal:  Positive for arthralgias. Negative for joint swelling.  Skin:  Negative for rash.       Recent derm visit  Allergic/Immunologic: Positive for environmental allergies. Negative for immunocompromised state.       Continues on flonase--intermittent in the off season  Neurological:  Negative for dizziness, syncope and light-headedness.       Occ  tension headaches  Hematological:  Negative for adenopathy. Does not bruise/bleed easily.  Psychiatric/Behavioral:  Negative for dysphoric mood and sleep disturbance.        Sleeps well with nightly CPAP Anxiety is controlled      Objective:   Physical Exam Constitutional:      Appearance: Normal appearance.  HENT:     Right Ear: Tympanic membrane normal.     Ears:     Comments: Cerumen on left    Mouth/Throat:     Pharynx: No oropharyngeal exudate or posterior oropharyngeal erythema.  Eyes:     Conjunctiva/sclera: Conjunctivae normal.     Pupils: Pupils are equal, round, and reactive to light.  Cardiovascular:     Rate and Rhythm: Normal rate and regular rhythm.     Pulses: Normal pulses.     Heart sounds: No murmur heard.   No gallop.  Pulmonary:     Effort: Pulmonary effort is normal.     Breath sounds: Normal breath sounds. No wheezing or rales.  Abdominal:     Palpations: Abdomen is soft.     Tenderness: There is no abdominal tenderness.  Musculoskeletal:     Cervical back: Neck supple.     Right lower leg: No edema.     Left lower leg: No edema.  Lymphadenopathy:     Cervical: No cervical adenopathy.  Skin:    Findings: No lesion or rash.  Neurological:     General: No focal deficit present.     Mental Status: He is alert and oriented to person, place, and time.  Psychiatric:        Mood and Affect: Mood normal.        Behavior: Behavior normal.           Assessment & Plan:

## 2020-12-27 NOTE — Assessment & Plan Note (Signed)
BP Readings from Last 3 Encounters:  12/27/20 118/82  12/25/19 106/82  06/05/19 118/78   Doing well on triamterene/HCTZ

## 2020-12-27 NOTE — Assessment & Plan Note (Signed)
Seems euthyroid 

## 2021-04-12 LAB — HM COLONOSCOPY

## 2021-06-03 ENCOUNTER — Other Ambulatory Visit: Payer: Self-pay

## 2021-06-03 ENCOUNTER — Emergency Department: Payer: 59

## 2021-06-03 ENCOUNTER — Emergency Department
Admission: EM | Admit: 2021-06-03 | Discharge: 2021-06-03 | Disposition: A | Discharge status: Home / Self Care | Payer: 59 | Attending: Emergency Medicine | Admitting: Emergency Medicine

## 2021-06-03 ENCOUNTER — Encounter: Payer: Self-pay | Admitting: Emergency Medicine

## 2021-06-03 DIAGNOSIS — N132 Hydronephrosis with renal and ureteral calculous obstruction: Secondary | ICD-10-CM | POA: Insufficient documentation

## 2021-06-03 DIAGNOSIS — N200 Calculus of kidney: Secondary | ICD-10-CM

## 2021-06-03 DIAGNOSIS — R339 Retention of urine, unspecified: Secondary | ICD-10-CM | POA: Diagnosis present

## 2021-06-03 LAB — CBC WITH DIFFERENTIAL/PLATELET
Abs Immature Granulocytes: 0.03 10*3/uL (ref 0.00–0.07)
Basophils Absolute: 0 10*3/uL (ref 0.0–0.1)
Basophils Relative: 0 %
Eosinophils Absolute: 0 10*3/uL (ref 0.0–0.5)
Eosinophils Relative: 0 %
HCT: 45.8 % (ref 39.0–52.0)
Hemoglobin: 16.3 g/dL (ref 13.0–17.0)
Immature Granulocytes: 0 %
Lymphocytes Relative: 23 %
Lymphs Abs: 2.2 10*3/uL (ref 0.7–4.0)
MCH: 32 pg (ref 26.0–34.0)
MCHC: 35.6 g/dL (ref 30.0–36.0)
MCV: 90 fL (ref 80.0–100.0)
Monocytes Absolute: 0.7 10*3/uL (ref 0.1–1.0)
Monocytes Relative: 8 %
Neutro Abs: 6.3 10*3/uL (ref 1.7–7.7)
Neutrophils Relative %: 69 %
Platelets: 247 10*3/uL (ref 150–400)
RBC: 5.09 MIL/uL (ref 4.22–5.81)
RDW: 11.9 % (ref 11.5–15.5)
WBC: 9.3 10*3/uL (ref 4.0–10.5)
nRBC: 0 % (ref 0.0–0.2)

## 2021-06-03 LAB — URINALYSIS, ROUTINE W REFLEX MICROSCOPIC
Bacteria, UA: NONE SEEN
Bilirubin Urine: NEGATIVE
Glucose, UA: NEGATIVE mg/dL
Ketones, ur: NEGATIVE mg/dL
Leukocytes,Ua: NEGATIVE
Nitrite: NEGATIVE
Protein, ur: NEGATIVE mg/dL
RBC / HPF: >50 RBC/hpf — ABNORMAL HIGH (ref 0–5)
Specific Gravity, Urine: 1.016 (ref 1.005–1.030)
pH: 5 (ref 5.0–8.0)

## 2021-06-03 LAB — BASIC METABOLIC PANEL
Anion gap: 11 (ref 5–15)
BUN: 19 mg/dL (ref 6–20)
CO2: 27 mmol/L (ref 22–32)
Calcium: 9.4 mg/dL (ref 8.9–10.3)
Chloride: 101 mmol/L (ref 98–111)
Creatinine, Ser: 1.14 mg/dL (ref 0.61–1.24)
GFR, Estimated: >60 mL/min (ref >60)
Glucose, Bld: 138 mg/dL — ABNORMAL HIGH (ref 70–99)
Potassium: 3.4 mmol/L — ABNORMAL LOW (ref 3.5–5.1)
Sodium: 139 mmol/L (ref 135–145)

## 2021-06-03 MED ORDER — KETOROLAC TROMETHAMINE 30 MG/ML IJ SOLN
30.0000 mg | Freq: Once | INTRAMUSCULAR | Status: AC
  Administered 2021-06-03: 30 mg via INTRAVENOUS
  Filled 2021-06-03: qty 1

## 2021-06-03 MED ORDER — SODIUM CHLORIDE 0.9 % IV BOLUS
1000.0000 mL | Freq: Once | INTRAVENOUS | Status: AC
  Administered 2021-06-03: 1000 mL via INTRAVENOUS

## 2021-06-03 NOTE — ED Notes (Signed)
Dc ppw provided, follow up info reviewed. Pt denies questions and decline vs at time of d/c/ pt provides verbal consent for dc at this time. Pt ambulatory to lobby with wife alert and oriented x4.

## 2021-06-03 NOTE — ED Provider Notes (Addendum)
[facility] Provider Note    Event Date/Time   First MD Initiated Contact with Patient 06/03/21 1901     (approximate)   History   Urinary Retention   HPI  Gregory [name] is a 51 y.o. male with no significant past medical history who presents with urinary retention/decreased urination since yesterday morning, associated with urgency and frequency.  The patient states that he constantly feels like he needs to urinate, but then when he tries to only a small amount comes out.  He feels pressure over his bladder like it is full.  This afternoon he started also having some pain in his left flank.  He had 1 episode of lightheadedness and sweating.  He denies vomiting or diarrhea.  He has no prior history of the symptoms.  He has not noted any blood in his urine; he states it is clear.   Physical Exam   Triage Vital Signs: ED Triage Vitals  Enc Vitals Group     BP 06/03/21 1738 121/80     Pulse Rate 06/03/21 1738 75     Resp 06/03/21 1738 18     Temp 06/03/21 1738 98.4 F (36.9 C)     Temp Source 06/03/21 1738 Oral     SpO2 06/03/21 1738 95 %     Weight 06/03/21 1654 234 lb (106.1 kg)     Height 06/03/21 1654 5\' 10"  (1.778 m)     Head Circumference --      Peak Flow --      Pain Score 06/03/21 1654 10     Pain Loc --      Pain Edu? --      Excl. in GC? --     Most recent vital signs: Vitals:   06/03/21 1738 06/03/21 1945  BP: 121/80 113/74  Pulse: 75 70  Resp: 18 16  Temp: 98.4 F (36.9 C)   SpO2: 95% 98%     General: Alert, relatively well-appearing. CV:  Good peripheral perfusion.  Resp:  Normal effort.  Abd:  Soft and nontender.  No distention.  Other:  No flank tenderness.   ED Results / Procedures / Treatments   Labs (all labs ordered are listed, but only abnormal results are displayed) Labs Reviewed  URINALYSIS, ROUTINE W REFLEX MICROSCOPIC - Abnormal; Notable for the following components:      Result Value   Color, Urine  YELLOW (*)    APPearance CLEAR (*)    Hgb urine dipstick SMALL (*)    RBC / HPF >50 (*)    All other components within normal limits  BASIC METABOLIC PANEL - Abnormal; Notable for the following components:   Potassium 3.4 (*)    Glucose, Bld 138 (*)    All other components within normal limits  CBC WITH DIFFERENTIAL/PLATELET     EKG     RADIOLOGY  CT abdomen/pelvis: I independently viewed and interpreted the images; there is a small left distal ureteral stone with mild hydronephrosis.  PROCEDURES:  Critical Care performed: No  Procedures   MEDICATIONS ORDERED IN ED: Medications  sodium chloride 0.9 % bolus 1,000 mL (0 mLs Intravenous Stopped 06/03/21 2105)  ketorolac (TORADOL) 30 MG/ML injection 30 mg (30 mg Intravenous Given 06/03/21 1940)     IMPRESSION / MDM / ASSESSMENT AND PLAN / ED COURSE  I reviewed the triage vital signs and the nursing notes.  51 year old male with no active medical problems presents with urinary retention, frequency, and urgency along with  some left flank pain.  On exam the patient is slightly uncomfortable but overall well-appearing.  His vital signs are normal.  The physical exam is unremarkable for acute findings.  He has no significant abdominal or flank tenderness.  Bladder scan was performed showing only 147 mL of urine, and I performed a bedside ultrasound confirming that the bladder does not appear full.  Urinalysis shows RBCs but no WBCs, bacteria, or other acute findings.  Differential diagnosis includes, but is not limited to, ureteral stone, cystitis, pyelonephritis, urinary tract obstruction, dehydration.  Patient's presentation is most consistent with acute complicated illness / injury requiring diagnostic workup.  We will obtain CT, basic labs, give fluids, Toradol, and reassess.  ----------------------------------------- 10:31 PM on 06/03/2021 -----------------------------------------  The patient had immediate relief  of his pain with Toradol.  BMP shows normal creatinine.  CT shows a small distal left ureteral stone with mild hydronephrosis.  This is consistent with the patient's presentation.  There is no evidence of urinary retention or significant obstruction.  Given the minimal pain and otherwise reassuring findings, the patient was stable for discharge home.  I counseled him on the results of the work-up.  I have given him urology referral.  He will take ibuprofen over-the-counter.  Given that he is allergic to sulfa drugs I did not prescribe Flomax.  Return precautions given, and the patient expresses understanding.  ----------------------------------------- 2:38 PM on 06/06/2021 -----------------------------------------  I called the patient back today because the CT had shown a 2 cm low-density lesion in the liver and the patient was not informed of this during initial visit.  I successfully contacted the patient by phone gave him the CT result and explained followup recommendations.  He agrees to follow-up with his PMD for further workup.  He states that he is feeling better and that the urinary symptoms have resolved, so he has likely passed the stone.   FINAL CLINICAL IMPRESSION(S) / ED DIAGNOSES   Final diagnoses:  Kidney stone     Rx / DC Orders   ED Discharge Orders     None        Note:  This document was prepared using Dragon voice recognition software and may include unintentional dictation errors.    Dionne Bucy, MD 06/03/21 9201    Dionne Bucy, MD 06/06/21 [phone]

## 2021-06-03 NOTE — ED Notes (Signed)
Bladder scan reads 147 mL

## 2021-06-03 NOTE — ED Notes (Signed)
Pt asking for urinal. Pt stating that they feel the urge to urinate but only produce a small amount or urine. Pt now stating that they have some discomfort in their L Flank area. Pt asking if kidney stone is possible. Pt given urinal and informed the MD will be in shortly.

## 2021-06-03 NOTE — Discharge Instructions (Signed)
Take ibuprofen 600mg  up to every 6 hours as needed for pain.  Return to the ER immediately for new, worsening or persistent severe pain, difficulty urinating, vomiting, fever or any other new or worsening symptoms that concern you.

## 2021-06-03 NOTE — ED Triage Notes (Signed)
Pt via POV from home. Pt here for urinary retention. Denies any prostate issues. Denies pain, only discomfort. Pt has gone to bathroom since yesterday morning. Pt is A&Ox4 and NAD

## 2021-06-06 ENCOUNTER — Telehealth: Payer: Self-pay

## 2021-06-06 DIAGNOSIS — K769 Liver disease, unspecified: Secondary | ICD-10-CM

## 2021-06-06 NOTE — Telephone Encounter (Signed)
Patient returning call, also concerned about liver, just received a call today about something found on his liver that he wants to talk with CMA and MD about, please return his call

## 2021-06-06 NOTE — Telephone Encounter (Signed)
Spoke to pt. He thinks he passed the stone yesterday. The ER DR called him today about the radiologist seeing a 2 cm low density area on the right lobe of the liver. Asking Dr Alphonsus Sias what he thinks needs to be done.

## 2021-06-06 NOTE — Telephone Encounter (Signed)
Left message to check on pt to see how he is doing after ER visit yesterday for kidney stone. Advised him to make sure he sets p with urology and asked that he call back or send a MyChart message with an update.

## 2021-06-07 NOTE — Telephone Encounter (Signed)
Ultrasound ordered. Let him know he should hear about this in the near future

## 2021-06-07 NOTE — Telephone Encounter (Signed)
I told him when I spoke to him earlier that if he did not hear from someone by early next week to call us back.

## 2021-06-07 NOTE — Addendum Note (Signed)
Addended by: Tillman Abide I on: 06/07/2021 08:46 AM   Modules accepted: Orders

## 2021-06-07 NOTE — Telephone Encounter (Signed)
Spoke to pt. He said as long as it is not in the hospital at an out pt setting, he would like it in [place].

## 2021-06-12 NOTE — Telephone Encounter (Signed)
Patient has not heard back, wanting to know an update.

## 2021-06-19 NOTE — Telephone Encounter (Signed)
I will forward this message to Asytyn, referral coordinator.

## 2021-06-19 NOTE — Telephone Encounter (Signed)
Wife called back (on DPR) to see if they need to call to make app or if they should be getting call from imaging? She would like call back at [phone]

## 2021-06-22 ENCOUNTER — Encounter: Payer: Self-pay | Admitting: *Deleted

## 2021-06-22 NOTE — Telephone Encounter (Signed)
Mychart message sent to scheduling instructions. Pt can call to schedule US ABDOMEN LIMITED RUQ

## 2021-06-22 NOTE — Telephone Encounter (Signed)
Called pt wife , LM to check Mychart and contact [facility] to schedule

## 2021-07-17 ENCOUNTER — Ambulatory Visit
Admission: RE | Admit: 2021-07-17 | Discharge: 2021-07-17 | Disposition: A | Discharge status: Home / Self Care | Payer: 59 | Source: Ambulatory Visit | Attending: Internal Medicine | Admitting: Internal Medicine

## 2021-07-17 ENCOUNTER — Telehealth: Payer: Self-pay | Admitting: *Deleted

## 2021-07-17 DIAGNOSIS — K769 Liver disease, unspecified: Secondary | ICD-10-CM | POA: Insufficient documentation

## 2021-07-17 NOTE — Telephone Encounter (Signed)
Ray with [facility] called to report test results to Dr. Alphonsus Sias. Results are in Epic.

## 2021-07-17 NOTE — Telephone Encounter (Signed)
Result released. Lesion not seen but radiologist suggested MRI (this is frustrating). We will check with him about how he would like to proceed

## 2021-07-18 DIAGNOSIS — K769 Liver disease, unspecified: Secondary | ICD-10-CM

## 2021-08-01 ENCOUNTER — Ambulatory Visit
Admission: RE | Admit: 2021-08-01 | Discharge: 2021-08-01 | Disposition: A | Discharge status: Home / Self Care | Payer: 59 | Source: Ambulatory Visit | Attending: Internal Medicine | Admitting: Internal Medicine

## 2021-08-01 DIAGNOSIS — K769 Liver disease, unspecified: Secondary | ICD-10-CM

## 2021-08-01 MED ORDER — GADOBENATE DIMEGLUMINE 529 MG/ML IV SOLN
20.0000 mL | Freq: Once | INTRAVENOUS | Status: AC | PRN
  Administered 2021-08-01: 20 mL via INTRAVENOUS

## 2021-12-18 ENCOUNTER — Encounter: Payer: 59 | Admitting: Internal Medicine

## 2021-12-25 ENCOUNTER — Encounter: Payer: Self-pay | Admitting: Internal Medicine

## 2021-12-26 ENCOUNTER — Telehealth: Payer: 59 | Admitting: Physician Assistant

## 2021-12-26 DIAGNOSIS — U071 COVID-19: Secondary | ICD-10-CM

## 2021-12-26 MED ORDER — BENZONATATE 100 MG PO CAPS: 100.0000 mg | ORAL_CAPSULE | Freq: Three times a day (TID) | ORAL | 0 refills | Status: DC | PRN

## 2021-12-26 MED ORDER — NIRMATRELVIR/RITONAVIR (PAXLOVID)TABLET: 3.0000 | ORAL_TABLET | Freq: Two times a day (BID) | ORAL | 0 refills | Status: AC

## 2021-12-26 NOTE — Patient Instructions (Signed)
[name], thank you for joining [name], PA-C for today's virtual visit.  While this provider is not your primary care provider (PCP), if your PCP is located in our provider database this encounter information will be shared with them immediately following your visit.   A [facility] MyChart account gives you access to today's visit and all your visits, tests, and labs performed at [facility] " click here if you don't have a Center MyChart account or go to [email or web address]  Consent: (Patient) Gregory [name] provided verbal consent for this virtual visit at the beginning of the encounter.  Current Medications:  Current Outpatient Medications:    benzonatate (TESSALON) 100 MG capsule, Take 1 capsule (100 mg total) by mouth 3 (three) times daily as needed., Disp: 30 capsule, Rfl: 0   nirmatrelvir/ritonavir EUA (PAXLOVID) 20 x 150 MG & 10 x 100MG  TABS, Take 3 tablets by mouth 2 (two) times daily for 5 days. (Take nirmatrelvir 150 mg two tablets twice daily for 5 days and ritonavir 100 mg one tablet twice daily for 5 days) Patient GFR is greater than 60, Disp: 30 tablet, Rfl: 0   ALPRAZolam (XANAX) 0.25 MG tablet, Take 1 tablet (0.25 mg total) by mouth 2 (two) times daily as needed. (Patient not taking: Reported on 06/03/2021), Disp: 30 tablet, Rfl: 0   escitalopram (LEXAPRO) 10 MG tablet, Take 1 tablet (10 mg total) by mouth daily., Disp: 90 tablet, Rfl: 3   fluticasone (FLONASE) 50 MCG/ACT nasal spray, Place 2 sprays into both nostrils daily. (Patient not taking: Reported on 06/03/2021), Disp: , Rfl:    levothyroxine (SYNTHROID) 125 MCG tablet, Take 1 tablet (125 mcg total) by mouth daily., Disp: 90 tablet, Rfl: 3   Melatonin 5 MG CHEW, Chew by mouth., Disp: , Rfl:    Probiotic Product (PROBIOTIC PO), Take by mouth., Disp: , Rfl:    triamterene-hydrochlorothiazide (MAXZIDE-25) 37.5-25 MG tablet, Take 1 tablet by mouth daily., Disp: 90 tablet, Rfl: 3    Medications ordered in this encounter:  Meds ordered this encounter  Medications   nirmatrelvir/ritonavir EUA (PAXLOVID) 20 x 150 MG & 10 x 100MG  TABS    Sig: Take 3 tablets by mouth 2 (two) times daily for 5 days. (Take nirmatrelvir 150 mg two tablets twice daily for 5 days and ritonavir 100 mg one tablet twice daily for 5 days) Patient GFR is greater than 60    Dispense:  30 tablet    Refill:  0    Order Specific Question:   Supervising Provider    Answer:   10-15-1990   benzonatate (TESSALON) 100 MG capsule    Sig: Take 1 capsule (100 mg total) by mouth 3 (three) times daily as needed.    Dispense:  30 capsule    Refill:  0    Order Specific Question:   Supervising Provider    Answer:   [name] [[id number]]     *If you need refills on other medications prior to your next appointment, please contact your pharmacy*  Follow-Up: Call back or seek an in-person evaluation if the symptoms worsen or if the condition fails to improve as anticipated.  [facility] [phone]  Other Instructions  COVID-19 COVID-19, or coronavirus disease 2019, is an infection that is caused by a new (novel) coronavirus called SARS-CoV-2. COVID-19 can cause many symptoms. In some people, the virus may not cause any symptoms. In others, it may cause mild or severe symptoms.  Some people with severe infection develop severe disease. What are the causes? This illness is caused by a virus. The virus may be in the air as tiny specks of fluid (aerosols) or droplets, or it may be on surfaces. You may catch the virus by: Breathing in droplets from an infected person. Droplets can be spread by a person breathing, speaking, singing, coughing, or sneezing. Touching something, like a table or a doorknob, that has virus on it (is contaminated) and then touching your mouth, nose, or eyes. What increases the risk? Risk for infection: You are more likely to get infected with the  COVID-19 virus if: You are within 6 ft (1.8 m) of a person with COVID-19 for 15 minutes or longer. You are providing care for a person who is infected with COVID-19. You are in close personal contact with other people. Close personal contact includes hugging, kissing, or sharing eating or drinking utensils. Risk for serious illness caused by COVID-19: You are more likely to get seriously ill from the COVID-19 virus if: You have cancer. You have a long-term (chronic) disease, such as: Chronic lung disease. This includes pulmonary embolism, chronic obstructive pulmonary disease, and cystic fibrosis. Long-term disease that lowers your body's ability to fight infection (immunocompromise). Serious cardiac conditions, such as heart failure, coronary artery disease, or cardiomyopathy. Diabetes. Chronic kidney disease. Liver diseases. These include cirrhosis, nonalcoholic fatty liver disease, alcoholic liver disease, or autoimmune hepatitis. You have obesity. You are pregnant or were recently pregnant. You have sickle cell disease. What are the signs or symptoms? Symptoms of this condition can range from mild to severe. Symptoms may appear any time from 2 to 14 days after being exposed to the virus. They include: Fever or chills. Shortness of breath or trouble breathing. Feeling tired or very tired. Headaches, body aches, or muscle aches. Runny or stuffy nose, sneezing, coughing, or sore throat. New loss of taste or smell. This is rare. Some people may also have stomach problems, such as nausea, vomiting, or diarrhea. Other people may not have any symptoms of COVID-19. How is this diagnosed? This condition may be diagnosed by testing samples to check for the COVID-19 virus. The most common tests are the PCR test and the antigen test. Tests may be done in the lab or at home. They include: Using a swab to take a sample of fluid from the back of your nose and throat (nasopharyngeal fluid), from  your nose, or from your throat. Testing a sample of saliva from your mouth. Testing a sample of coughed-up mucus from your lungs (sputum). How is this treated? Treatment for COVID-19 infection depends on the severity of the condition. Mild symptoms can be managed at home with rest, fluids, and over-the-counter medicines. Serious symptoms may be treated in a hospital intensive care unit (ICU). Treatment in the ICU may include: Supplemental oxygen. Extra oxygen is given through a tube in the nose, a face mask, or a hood. Medicines. These may include: Antivirals, such as monoclonal antibodies. These help your body fight off certain viruses that can cause disease. Anti-inflammatories, such as corticosteroids. These reduce inflammation and suppress the immune system. Antithrombotics. These prevent or treat blood clots, if they develop. Convalescent plasma. This helps boost your immune system, if you have an underlying immunosuppressive condition or are getting immunosuppressive treatments. Prone positioning. This means you will lie on your stomach. This helps oxygen to get into your lungs. Infection control measures. If you are at risk for more serious illness caused  by COVID-19, your health care provider may prescribe two long-acting monoclonal antibodies, given together every 6 months. How is this prevented? To protect yourself: Use preventive medicine (pre-exposure prophylaxis). You may get pre-exposure prophylaxis if you have moderate or severe immunocompromise. Get vaccinated. Anyone 32 months old or older who meets guidelines can get a COVID-19 vaccine or vaccine series. This includes people who are pregnant or making breast milk (lactating). Get an added dose of COVID-19 vaccine after your first vaccine or vaccine series if you have moderate to severe immunocompromise. This applies if you have had a solid organ transplant or have been diagnosed with an immunocompromising condition. You should  get the added dose 4 weeks after you got the first COVID-19 vaccine or vaccine series. If you get an mRNA vaccine, you will need a 3-dose primary series. If you get the J&J/Janssen vaccine, you will need a 2-dose primary series, with the second dose being an mRNA vaccine. Talk to your health care provider about getting experimental monoclonal antibodies. This treatment is approved under emergency use authorization to prevent severe illness before or after being exposed to the COVID-19 virus. You may be given monoclonal antibodies if: You have moderate or severe immunocompromise. This includes treatments that lower your immune response. People with immunocompromise may not develop protection against COVID-19 when they are vaccinated. You cannot be vaccinated. You may not get a vaccine if you have a severe allergic reaction to the vaccine or its components. You are not fully vaccinated. You are in a facility where COVID-19 is present and: Are in close contact with a person who is infected with the COVID-19 virus. Are at high risk of being exposed to the COVID-19 virus. You are at risk of illness from new variants of the COVID-19 virus. To protect others: If you have symptoms of COVID-19, take steps to prevent the virus from spreading to others. Stay home. Leave your house only to get medical care. Do not use public transit, if possible. Do not travel while you are sick. Wash your hands often with soap and water for at least 20 seconds. If soap and water are not available, use alcohol-based hand sanitizer. Make sure that all people in your household wash their hands well and often. Cough or sneeze into a tissue or your sleeve or elbow. Do not cough or sneeze into your hand or into the air. Where to find more information Centers for Disease Control and Prevention: https://www.clark-whitaker.org/ World Health Organization: https://thompson-craig.com/ Get help right away if: You have trouble  breathing. You have pain or pressure in your chest. You are confused. You have bluish lips and fingernails. You have trouble waking from sleep. You have symptoms that get worse. These symptoms may be an emergency. Get help right away. Call 911. Do not wait to see if the symptoms will go away. Do not drive yourself to the hospital. Summary COVID-19 is an infection that is caused by a new coronavirus. Sometimes, there are no symptoms. Other times, symptoms range from mild to severe. Some people with a severe COVID-19 infection develop severe disease. The virus that causes COVID-19 can spread from person to person through droplets or aerosols from breathing, speaking, singing, coughing, or sneezing. Mild symptoms of COVID-19 can be managed at home with rest, fluids, and over-the-counter medicines. This information is not intended to replace advice given to you by your health care provider. Make sure you discuss any questions you have with your health care provider. Document Revised: 12/13/2020 Document Reviewed:  12/15/2020 Elsevier Patient Education  2023 Elsevier Inc.    If you have been instructed to have an in-person evaluation today at a local Urgent Care facility, please use the link below. It will take you to a list of all of our available [facility], including address, phone number and hours of operation. Please do not delay care.  [facility]  If you or a family member do not have a primary care provider, use the link below to schedule a visit and establish care. When you choose a [facility] primary care physician or advanced practice provider, you gain a long-term partner in health. Find a Primary Care Provider  Learn more about [facility] in-office and virtual care options: [facility] - Get Care Now

## 2021-12-26 NOTE — Progress Notes (Signed)
Virtual Visit Consent   Gregory [name], you are scheduled for a virtual visit with a [facility] provider today. Just as with appointments in the office, your consent must be obtained to participate. Your consent will be active for this visit and any virtual visit you may have with one of our providers in the next 365 days. If you have a MyChart account, a copy of this consent can be sent to you electronically.  As this is a virtual visit, video technology does not allow for your provider to perform a traditional examination. This may limit your provider's ability to fully assess your condition. If your provider identifies any concerns that need to be evaluated in person or the need to arrange testing (such as labs, EKG, etc.), we will make arrangements to do so. Although advances in technology are sophisticated, we cannot ensure that it will always work on either your end or our end. If the connection with a video visit is poor, the visit may have to be switched to a telephone visit. With either a video or telephone visit, we are not always able to ensure that we have a secure connection.  By engaging in this virtual visit, you consent to the provision of healthcare and authorize for your insurance to be billed (if applicable) for the services provided during this visit. Depending on your insurance coverage, you may receive a charge related to this service.  I need to obtain your verbal consent now. Are you willing to proceed with your visit today? Gregory [name] has provided verbal consent on 12/26/2021 for a virtual visit (video or telephone). Gregory Loveless, PA-C  Date: 12/26/2021 10:52 AM  Virtual Visit via Video Note   I, Gregory [name], connected with  Gregory [name]  ([id number], [birth year]) on 12/26/21 at 10:15 AM EST by a video-enabled telemedicine application and verified that I am speaking with the correct person using two identifiers.  Location: Patient: Virtual Visit  Location Patient: Home Provider: Virtual Visit Location Provider: Home Office   I discussed the limitations of evaluation and management by telemedicine and the availability of in person appointments. The patient expressed understanding and agreed to proceed.    History of Present Illness: Gregory [name] is a 51 y.o. who identifies as a male who was assigned male at birth, and is being seen today for Covid 61.  HPI: URI  This is a new problem. The current episode started in the past 7 days (Symptoms started 12/24/21; Tested positive on at home test on 12/25/21). The problem has been gradually worsening. Maximum temperature: 99.8. The fever has been present for Less than 1 day. Associated symptoms include chest pain (burning with cough), congestion, coughing, ear pain (right ear), headaches, joint pain, nausea (borderline yesterday), rhinorrhea, sinus pain and a sore throat (mild). Pertinent negatives include no diarrhea, plugged ear sensation, vomiting or wheezing. Associated symptoms comments: Chills, fatigue, decreased appetite. Treatments tried: thera flu day and night, ibuprofen. The treatment provided no relief.    O2 sats 94-97%  Problems:  Patient Active Problem List   Diagnosis Date Noted   Preventative health care 10/29/2018   Hypertension    Osteoarthritis of multiple joints    Mood disorder (HCC)    Hypothyroidism, adult    Obstructive sleep apnea     Allergies:  Allergies  Allergen Reactions   Sulfasalazine Anaphylaxis and Rash   Gluten Meal    Sulfa Antibiotics    Medications:  Current Outpatient Medications:    benzonatate (  TESSALON) 100 MG capsule, Take 1 capsule (100 mg total) by mouth 3 (three) times daily as needed., Disp: 30 capsule, Rfl: 0   nirmatrelvir/ritonavir EUA (PAXLOVID) 20 x 150 MG & 10 x 100MG  TABS, Take 3 tablets by mouth 2 (two) times daily for 5 days. (Take nirmatrelvir 150 mg two tablets twice daily for 5 days and ritonavir 100 mg one tablet twice  daily for 5 days) Patient GFR is greater than 60, Disp: 30 tablet, Rfl: 0   ALPRAZolam (XANAX) 0.25 MG tablet, Take 1 tablet (0.25 mg total) by mouth 2 (two) times daily as needed. (Patient not taking: Reported on 06/03/2021), Disp: 30 tablet, Rfl: 0   escitalopram (LEXAPRO) 10 MG tablet, Take 1 tablet (10 mg total) by mouth daily., Disp: 90 tablet, Rfl: 3   fluticasone (FLONASE) 50 MCG/ACT nasal spray, Place 2 sprays into both nostrils daily. (Patient not taking: Reported on 06/03/2021), Disp: , Rfl:    levothyroxine (SYNTHROID) 125 MCG tablet, Take 1 tablet (125 mcg total) by mouth daily., Disp: 90 tablet, Rfl: 3   Melatonin 5 MG CHEW, Chew by mouth., Disp: , Rfl:    Probiotic Product (PROBIOTIC PO), Take by mouth., Disp: , Rfl:    triamterene-hydrochlorothiazide (MAXZIDE-25) 37.5-25 MG tablet, Take 1 tablet by mouth daily., Disp: 90 tablet, Rfl: 3  Observations/Objective: Patient is well-developed, well-nourished in no acute distress.  Resting comfortably at home.  Head is normocephalic, atraumatic.  No labored breathing.  Speech is clear and coherent with logical content.  Patient is alert and oriented at baseline.    Assessment and Plan: 1. COVID-19 - nirmatrelvir/ritonavir EUA (PAXLOVID) 20 x 150 MG & 10 x 100MG  TABS; Take 3 tablets by mouth 2 (two) times daily for 5 days. (Take nirmatrelvir 150 mg two tablets twice daily for 5 days and ritonavir 100 mg one tablet twice daily for 5 days) Patient GFR is greater than 60  Dispense: 30 tablet; Refill: 0 - benzonatate (TESSALON) 100 MG capsule; Take 1 capsule (100 mg total) by mouth 3 (three) times daily as needed.  Dispense: 30 capsule; Refill: 0 - MyChart COVID-19 home monitoring program; Future  - Continue OTC symptomatic management of choice - Will send OTC vitamins and supplement information through AVS - Paxlovid and tessalon prescribed - Patient enrolled in MyChart symptom monitoring - Push fluids - Rest as needed - Discussed  return precautions and when to seek in-person evaluation, sent via AVS as well   Follow Up Instructions: I discussed the assessment and treatment plan with the patient. The patient was provided an opportunity to ask questions and all were answered. The patient agreed with the plan and demonstrated an understanding of the instructions.  A copy of instructions were sent to the patient via MyChart unless otherwise noted below.    The patient was advised to call back or seek an in-person evaluation if the symptoms worsen or if the condition fails to improve as anticipated.  Time:  I spent 15 minutes with the patient via telehealth technology discussing the above problems/concerns.    10-15-1990, PA-C

## 2021-12-26 NOTE — Telephone Encounter (Signed)
Spoke to  patient by telephone and helped him set-up an e-visit with [facility] since no appointments available today at our office.

## 2021-12-26 NOTE — Progress Notes (Signed)
   Thank you for the details you included in the comment boxes. Those details are very helpful in determining the best course of treatment for you and help [place] to provide the best care.Because are interested in anti-viral therapy, we recommend that you convert this visit to a video visit in order for the provider to better assess what is going on.  The provider will be able to give you a more accurate diagnosis and treatment plan if we can more freely discuss your symptoms and with the addition of a virtual examination.   If you convert to a video visit, we will bill your insurance (similar to an office visit) and you will not be charged for this e-Visit. You will be able to stay at home and speak with the first available [facility] advanced practice provider. The link to do a video visit is in the drop down Menu tab of your Welcome screen in MyChart.  I have spent 5 minutes in review of e-visit questionnaire, review and updating patient chart, medical decision making and response to patient.   [name], PA-C

## 2021-12-29 ENCOUNTER — Encounter: Payer: 59 | Admitting: Internal Medicine

## 2022-01-02 ENCOUNTER — Telehealth: Payer: Self-pay

## 2022-01-02 NOTE — Telephone Encounter (Signed)
BPA triggered for worsening symptoms weakness and worse appetite compare to yesterday completed on 12/30/21. Patient called, left VM to return the call to a nurse if needed to discuss worsening symptoms [phone].

## 2022-01-04 ENCOUNTER — Telehealth: Payer: Self-pay

## 2022-01-04 NOTE — Telephone Encounter (Signed)
Pt called, LVMTCB to speak with nurse d/t  BPA triggered for worsening symptoms SOB and decreased appetite.     Question 01/04/2022  4:12 PM EST - Filed by Patient  Are you feeling short of breath today? Yes  Is the shortness of breath better, the same, or worse than yesterday? Worse Abnormal   Are you having a cough today? Yes  Is the cough better, the same, or worse than yesterday? Worse Abnormal   Are you experiencing weakness today? Yes  Is the weakness better, the same, or worse than yesterday? Same  Are you vomiting? No  How is your appetite compared to yesterday? Worse Abnormal   Are you experiencing diarrhea? No

## 2022-01-06 ENCOUNTER — Telehealth: Payer: Self-pay

## 2022-01-06 NOTE — Telephone Encounter (Signed)
Called patient in regard to COVID symptoms. No answer, left VM to call community line for additional questions.

## 2022-01-07 ENCOUNTER — Telehealth: Payer: 59 | Admitting: Nurse Practitioner

## 2022-01-07 DIAGNOSIS — U099 Post covid-19 condition, unspecified: Secondary | ICD-10-CM

## 2022-01-07 MED ORDER — PREDNISONE 10 MG PO TABS: ORAL_TABLET | ORAL | 0 refills | Status: DC

## 2022-01-07 MED ORDER — PROMETHAZINE-DM 6.25-15 MG/5ML PO SYRP: 5.0000 mL | ORAL_SOLUTION | Freq: Four times a day (QID) | ORAL | 0 refills | Status: DC | PRN

## 2022-01-07 NOTE — Patient Instructions (Signed)
[name], thank you for joining Claiborne Rigg, NP for today's virtual visit.  While this provider is not your primary care provider (PCP), if your PCP is located in our provider database this encounter information will be shared with them immediately following your visit.   A [place] MyChart account gives you access to today's visit and all your visits, tests, and labs performed at [facility] " click here if you don't have a [facility] MyChart account or go to [email or web address]  Consent: (Patient) Gregory [name] provided verbal consent for this virtual visit at the beginning of the encounter.  Current Medications:  Current Outpatient Medications:    predniSONE (DELTASONE) 10 MG tablet, Directions for 6 day taper: Day 1: 2 tablets before breakfast, 1 after both lunch & dinner and 2 at bedtime Day 2: 1 tab before breakfast, 1 after both lunch & dinner and 2 at bedtime Day 3: 1 tab at each meal & 1 at bedtime Day 4: 1 tab at breakfast, 1 at lunch, 1 at bedtime Day 5: 1 tab at breakfast & 1 tab at bedtime Day 6: 1 tab at breakfast, Disp: 21 tablet, Rfl: 0   promethazine-dextromethorphan (PROMETHAZINE-DM) 6.25-15 MG/5ML syrup, Take 5 mLs by mouth 4 (four) times daily as needed for cough., Disp: 240 mL, Rfl: 0   ALPRAZolam (XANAX) 0.25 MG tablet, Take 1 tablet (0.25 mg total) by mouth 2 (two) times daily as needed. (Patient not taking: Reported on 06/03/2021), Disp: 30 tablet, Rfl: 0   benzonatate (TESSALON) 100 MG capsule, Take 1 capsule (100 mg total) by mouth 3 (three) times daily as needed., Disp: 30 capsule, Rfl: 0   escitalopram (LEXAPRO) 10 MG tablet, Take 1 tablet (10 mg total) by mouth daily., Disp: 90 tablet, Rfl: 3   fluticasone (FLONASE) 50 MCG/ACT nasal spray, Place 2 sprays into both nostrils daily. (Patient not taking: Reported on 06/03/2021), Disp: , Rfl:    levothyroxine (SYNTHROID) 125 MCG tablet, Take 1 tablet (125 mcg total) by mouth daily., Disp: 90  tablet, Rfl: 3   Melatonin 5 MG CHEW, Chew by mouth., Disp: , Rfl:    Probiotic Product (PROBIOTIC PO), Take by mouth., Disp: , Rfl:    triamterene-hydrochlorothiazide (MAXZIDE-25) 37.5-25 MG tablet, Take 1 tablet by mouth daily., Disp: 90 tablet, Rfl: 3   Medications ordered in this encounter:  Meds ordered this encounter  Medications   promethazine-dextromethorphan (PROMETHAZINE-DM) 6.25-15 MG/5ML syrup    Sig: Take 5 mLs by mouth 4 (four) times daily as needed for cough.    Dispense:  240 mL    Refill:  0    Order Specific Question:   Supervising Provider    Answer:   [name] [[id number]]   predniSONE (DELTASONE) 10 MG tablet    Sig: Directions for 6 day taper: Day 1: 2 tablets before breakfast, 1 after both lunch & dinner and 2 at bedtime Day 2: 1 tab before breakfast, 1 after both lunch & dinner and 2 at bedtime Day 3: 1 tab at each meal & 1 at bedtime Day 4: 1 tab at breakfast, 1 at lunch, 1 at bedtime Day 5: 1 tab at breakfast & 1 tab at bedtime Day 6: 1 tab at breakfast    Dispense:  21 tablet    Refill:  0    Order Specific Question:   Supervising Provider    Answer:   [name] [id number]     *If you need refills on other medications prior  to your next appointment, please contact your pharmacy*  Follow-Up: Call back or seek an in-person evaluation if the symptoms worsen or if the condition fails to improve as anticipated.  [facility] [phone]  Other Instructions INSTRUCTIONS: use a humidifier for nasal congestion Drink plenty of fluids, rest and wash hands frequently to avoid the spread of infection Alternate tylenol and Motrin for relief of fever    If you have been instructed to have an in-person evaluation today at a local Urgent Care facility, please use the link below. It will take you to a list of all of our available [facility], including address, phone number and hours of operation. Please do not delay care.  [facility]  If you or a family member do not have a primary care provider, use the link below to schedule a visit and establish care. When you choose a [facility] primary care physician or advanced practice provider, you gain a long-term partner in health. Find a Primary Care Provider  Learn more about [facility]'s in-office and virtual care options:  - Get Care Now

## 2022-01-07 NOTE — Progress Notes (Signed)
Virtual Visit Consent   Gregory [name], you are scheduled for a virtual visit with a [facility] provider today. Just as with appointments in the office, your consent must be obtained to participate. Your consent will be active for this visit and any virtual visit you may have with one of our providers in the next 365 days. If you have a MyChart account, a copy of this consent can be sent to you electronically.  As this is a virtual visit, video technology does not allow for your provider to perform a traditional examination. This may limit your provider's ability to fully assess your condition. If your provider identifies any concerns that need to be evaluated in person or the need to arrange testing (such as labs, EKG, etc.), we will make arrangements to do so. Although advances in technology are sophisticated, we cannot ensure that it will always work on either your end or our end. If the connection with a video visit is poor, the visit may have to be switched to a telephone visit. With either a video or telephone visit, we are not always able to ensure that we have a secure connection.  By engaging in this virtual visit, you consent to the provision of healthcare and authorize for your insurance to be billed (if applicable) for the services provided during this visit. Depending on your insurance coverage, you may receive a charge related to this service.  I need to obtain your verbal consent now. Are you willing to proceed with your visit today? Gregory [name] has provided verbal consent on 01/07/2022 for a virtual visit (video or telephone). Gregory Pounds, NP  Date: 01/07/2022 10:42 AM  Virtual Visit via Video Note   I, Gregory [name], connected with  Gregory [name]  ([id number], May 15, 1970) on 01/07/22 at 10:30 AM EST by a video-enabled telemedicine application and verified that I am speaking with the correct person using two identifiers.  Location: Patient: Virtual Visit Location  Patient: Home Provider: Virtual Visit Location Provider: Home Office   I discussed the limitations of evaluation and management by telemedicine and the availability of in person appointments. The patient expressed understanding and agreed to proceed.    History of Present Illness: Gregory [name] is a 51 y.o. who identifies as a male who was assigned male at birth, and is being seen today for post covid symptoms.   Gregory [name] was treated for COVID last week with paxlovid. He reports since completing Paxlovid he is not feeling much better and continues to have dry persistent cough, low appetite, loss of taste and smell, memory loss, swollen cervical lymph nodes, and nasal congestion.  He is taking ibuprofen but reports Tessalon has been ineffective with relieving cough.  He is also not sleeping well at night.  He does have an upcoming scheduled appointment with his primary care in a few days which I have encouraged him to keep   Problems:  Patient Active Problem List   Diagnosis Date Noted   Preventative health care 10/29/2018   Hypertension    Osteoarthritis of multiple joints    Mood disorder (HCC)    Hypothyroidism, adult    Obstructive sleep apnea     Allergies:  Allergies  Allergen Reactions   Sulfasalazine Anaphylaxis and Rash   Gluten Meal    Sulfa Antibiotics    Medications:  Current Outpatient Medications:    predniSONE (DELTASONE) 10 MG tablet, Directions for 6 day taper: Day 1: 2 tablets before breakfast, 1 after both lunch &  dinner and 2 at bedtime Day 2: 1 tab before breakfast, 1 after both lunch & dinner and 2 at bedtime Day 3: 1 tab at each meal & 1 at bedtime Day 4: 1 tab at breakfast, 1 at lunch, 1 at bedtime Day 5: 1 tab at breakfast & 1 tab at bedtime Day 6: 1 tab at breakfast, Disp: 21 tablet, Rfl: 0   promethazine-dextromethorphan (PROMETHAZINE-DM) 6.25-15 MG/5ML syrup, Take 5 mLs by mouth 4 (four) times daily as needed for cough., Disp: 240 mL, Rfl: 0    ALPRAZolam (XANAX) 0.25 MG tablet, Take 1 tablet (0.25 mg total) by mouth 2 (two) times daily as needed. (Patient not taking: Reported on 06/03/2021), Disp: 30 tablet, Rfl: 0   benzonatate (TESSALON) 100 MG capsule, Take 1 capsule (100 mg total) by mouth 3 (three) times daily as needed., Disp: 30 capsule, Rfl: 0   escitalopram (LEXAPRO) 10 MG tablet, Take 1 tablet (10 mg total) by mouth daily., Disp: 90 tablet, Rfl: 3   fluticasone (FLONASE) 50 MCG/ACT nasal spray, Place 2 sprays into both nostrils daily. (Patient not taking: Reported on 06/03/2021), Disp: , Rfl:    levothyroxine (SYNTHROID) 125 MCG tablet, Take 1 tablet (125 mcg total) by mouth daily., Disp: 90 tablet, Rfl: 3   Melatonin 5 MG CHEW, Chew by mouth., Disp: , Rfl:    Probiotic Product (PROBIOTIC PO), Take by mouth., Disp: , Rfl:    triamterene-hydrochlorothiazide (MAXZIDE-25) 37.5-25 MG tablet, Take 1 tablet by mouth daily., Disp: 90 tablet, Rfl: 3  Observations/Objective: Patient is well-developed, well-nourished in no acute distress.  Resting comfortably in bed at home.  Head is normocephalic, atraumatic.  No labored breathing.  Speech is clear and coherent with logical content.  Patient is alert and oriented at baseline.    Assessment and Plan: 1. Post-COVID syndrome - promethazine-dextromethorphan (PROMETHAZINE-DM) 6.25-15 MG/5ML syrup; Take 5 mLs by mouth 4 (four) times daily as needed for cough.  Dispense: 240 mL; Refill: 0 - predniSONE (DELTASONE) 10 MG tablet; Directions for 6 day taper: Day 1: 2 tablets before breakfast, 1 after both lunch & dinner and 2 at bedtime Day 2: 1 tab before breakfast, 1 after both lunch & dinner and 2 at bedtime Day 3: 1 tab at each meal & 1 at bedtime Day 4: 1 tab at breakfast, 1 at lunch, 1 at bedtime Day 5: 1 tab at breakfast & 1 tab at bedtime Day 6: 1 tab at breakfast  Dispense: 21 tablet; Refill: 0 INSTRUCTIONS: use a humidifier for nasal congestion Drink plenty of fluids, rest and wash  hands frequently to avoid the spread of infection Alternate tylenol and Motrin for relief of fever   Follow Up Instructions: I discussed the assessment and treatment plan with the patient. The patient was provided an opportunity to ask questions and all were answered. The patient agreed with the plan and demonstrated an understanding of the instructions.  A copy of instructions were sent to the patient via MyChart unless otherwise noted below.    The patient was advised to call back or seek an in-person evaluation if the symptoms worsen or if the condition fails to improve as anticipated.  Time:  I spent 12 minutes with the patient via telehealth technology discussing the above problems/concerns.    Claiborne Rigg, NP

## 2022-01-10 ENCOUNTER — Ambulatory Visit (INDEPENDENT_AMBULATORY_CARE_PROVIDER_SITE_OTHER): Payer: 59 | Admitting: Internal Medicine

## 2022-01-10 ENCOUNTER — Encounter: Payer: Self-pay | Admitting: Internal Medicine

## 2022-01-10 VITALS — BP 112/74 | HR 68 | Temp 97.1°F | Ht 70.5 in | Wt 235.0 lb

## 2022-01-10 DIAGNOSIS — E039 Hypothyroidism, unspecified: Secondary | ICD-10-CM | POA: Diagnosis not present

## 2022-01-10 DIAGNOSIS — F39 Unspecified mood [affective] disorder: Secondary | ICD-10-CM

## 2022-01-10 DIAGNOSIS — Z0000 Encounter for general adult medical examination without abnormal findings: Secondary | ICD-10-CM | POA: Diagnosis not present

## 2022-01-10 DIAGNOSIS — I10 Essential (primary) hypertension: Secondary | ICD-10-CM

## 2022-01-10 DIAGNOSIS — Z125 Encounter for screening for malignant neoplasm of prostate: Secondary | ICD-10-CM

## 2022-01-10 DIAGNOSIS — G4733 Obstructive sleep apnea (adult) (pediatric): Secondary | ICD-10-CM | POA: Diagnosis not present

## 2022-01-10 MED ORDER — TRIAMTERENE-HCTZ 37.5-25 MG PO TABS: 1.0000 | ORAL_TABLET | Freq: Every day | ORAL | 3 refills | Status: DC

## 2022-01-10 MED ORDER — ALPRAZOLAM 0.25 MG PO TABS: 0.2500 mg | ORAL_TABLET | Freq: Two times a day (BID) | ORAL | 0 refills | Status: DC | PRN

## 2022-01-10 MED ORDER — LEVOTHYROXINE SODIUM 125 MCG PO TABS: 125.0000 ug | ORAL_TABLET | Freq: Every day | ORAL | 3 refills | Status: DC

## 2022-01-10 MED ORDER — ESCITALOPRAM OXALATE 10 MG PO TABS: 10.0000 mg | ORAL_TABLET | Freq: Every day | ORAL | 3 refills | Status: DC

## 2022-01-10 NOTE — Assessment & Plan Note (Signed)
Uses CPAP nightly 

## 2022-01-10 NOTE — Progress Notes (Signed)
Subjective:    Patient ID: Steward Sames, male    DOB: 03/15/1970, 52 y.o.   MRN: [id number]  HPI Here for physical  Now doing training in his job---and travelling more Anxiety is still under control Uses the xanax when he is flying  Has lost about 15# Reduced alcohol and lots of gluten (felt he was sensitive)  Recovering from [place] now [place] it ~3 weeks ago Improved with paxlovid--but got rebound Spent over a week in bed Now on steroids and cough med Breathing is better Still some brain fog  Current Outpatient Medications on File Prior to Visit  Medication Sig Dispense Refill   ALPRAZolam (XANAX) 0.25 MG tablet Take 1 tablet (0.25 mg total) by mouth 2 (two) times daily as needed. 30 tablet 0   benzonatate (TESSALON) 100 MG capsule Take 1 capsule (100 mg total) by mouth 3 (three) times daily as needed. 30 capsule 0   escitalopram (LEXAPRO) 10 MG tablet Take 1 tablet (10 mg total) by mouth daily. 90 tablet 3   fluticasone (FLONASE) 50 MCG/ACT nasal spray Place 2 sprays into both nostrils daily.     levothyroxine (SYNTHROID) 125 MCG tablet Take 1 tablet (125 mcg total) by mouth daily. 90 tablet 3   Melatonin 5 MG CHEW Chew by mouth.     predniSONE (DELTASONE) 10 MG tablet Directions for 6 day taper: Day 1: 2 tablets before breakfast, 1 after both lunch & dinner and 2 at bedtime Day 2: 1 tab before breakfast, 1 after both lunch & dinner and 2 at bedtime Day 3: 1 tab at each meal & 1 at bedtime Day 4: 1 tab at breakfast, 1 at lunch, 1 at bedtime Day 5: 1 tab at breakfast & 1 tab at bedtime Day 6: 1 tab at breakfast 21 tablet 0   Probiotic Product (PROBIOTIC PO) Take by mouth.     promethazine-dextromethorphan (PROMETHAZINE-DM) 6.25-15 MG/5ML syrup Take 5 mLs by mouth 4 (four) times daily as needed for cough. 240 mL 0   triamterene-hydrochlorothiazide (MAXZIDE-25) 37.5-25 MG tablet Take 1 tablet by mouth daily. 90 tablet 3   No current facility-administered medications on file prior  to visit.    Allergies  Allergen Reactions   Sulfasalazine Anaphylaxis and Rash   Gluten Meal    Sulfa Antibiotics     Past Medical History:  Diagnosis Date   Hypertension    Hypothyroidism, adult    Mood disorder ([place])    Non-celiac gluten sensitivity    Obstructive sleep apnea    Better with CPAP   Osteoarthritis of multiple joints    knees and ankles    Past Surgical History:  Procedure Laterality Date   NO PAST SURGERIES      Family History  Problem Relation Age of Onset   Heart disease Mother        valve repair   Breast cancer Mother    Diabetes Father    Obesity Father    Prostate cancer Maternal Uncle    Colon cancer Paternal Uncle    Diabetes Paternal Grandmother     Social History   Socioeconomic History   Marital status: Married    Spouse name: Not on file   Number of children: 3   Years of education: Not on file   Highest education level: Not on file  Occupational History   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  Tobacco Use   Smoking status: Former    Types: Landscape architect  Passive exposure: Past   Smokeless tobacco: Never   Tobacco comments:    1 every 1-2 months  Vaping Use   Vaping Use: Some days  Substance and Sexual Activity   Alcohol use: Yes   Drug use: No   Sexual activity: Not on file  Other Topics Concern   Not on file  Social History Narrative   Not on file   Social Determinants of Health   Financial Resource Strain: Not on file  Food Insecurity: Not on file  Transportation Needs: Not on file  Physical Activity: Not on file  Stress: Not on file  Social Connections: Not on file  Intimate Partner Violence: Not on file   Review of Systems  Constitutional:        Has been exercising some Wears seat belt  HENT:  Negative for dental problem, hearing loss, tinnitus and trouble swallowing.        Keeps up with dentist  Eyes:  Negative for visual disturbance.       No diplopia or unilateral  vision loss  Respiratory:         Cough improved SOB has improved  Cardiovascular:  Negative for chest pain, palpitations and leg swelling.  Gastrointestinal:  Negative for blood in stool and constipation.       Bowels better with less gluten No heartburn  Endocrine: Negative for polydipsia and polyuria.  Genitourinary:  Negative for urgency.       Slight dribbling No sexual problems  Musculoskeletal:  Positive for arthralgias. Negative for back pain and joint swelling.       Right shoulder issues--seeing ortho  Skin:  Negative for rash.  Allergic/Immunologic: Positive for environmental allergies. Negative for immunocompromised state.       Mild this year Flonase in season  Neurological:  Negative for dizziness, syncope and light-headedness.       Occ stress headaches--ibuprofen helps  Hematological:  Does not bruise/bleed easily.       Had some cervical nodes during COVID  Psychiatric/Behavioral:  Negative for dysphoric mood and sleep disturbance.        Sleeps well with CPAP       Objective:   Physical Exam Constitutional:      Appearance: Normal appearance.  HENT:     Mouth/Throat:     Pharynx: No oropharyngeal exudate or posterior oropharyngeal erythema.  Eyes:     Conjunctiva/sclera: Conjunctivae normal.     Pupils: Pupils are equal, round, and reactive to light.  Neck:     Comments: Small slightly tender anterior cervical nodes Cardiovascular:     Rate and Rhythm: Normal rate and regular rhythm.     Pulses: Normal pulses.     Heart sounds: No murmur heard.    No gallop.  Pulmonary:     Effort: Pulmonary effort is normal.     Breath sounds: Normal breath sounds. No wheezing or rales.  Abdominal:     Palpations: Abdomen is soft.     Tenderness: There is no abdominal tenderness.  Musculoskeletal:     Cervical back: Neck supple.     Right lower leg: No edema.     Left lower leg: No edema.  Skin:    Findings: No lesion or rash.  Neurological:     General: No  focal deficit present.     Mental Status: He is alert and oriented to person, place, and time.  Psychiatric:        Mood and Affect: Mood normal.  Behavior: Behavior normal.            Assessment & Plan:

## 2022-01-10 NOTE — Assessment & Plan Note (Signed)
Anxiety controlled with escitalopram 10 Xanax for flying

## 2022-01-10 NOTE — Assessment & Plan Note (Signed)
Has worked well on lifestyle with weight loss Recovering from [place] now Will do shingrix soon--?next year Consider updated COVID vaccine in 3 months Will check PSA with + FH Colon done last year--due again in ?5 years

## 2022-01-10 NOTE — Assessment & Plan Note (Signed)
BP Readings from Last 3 Encounters:  01/10/22 112/74  06/03/21 113/74  12/27/20 118/82   Contorlled on maxzide 25

## 2022-01-10 NOTE — Assessment & Plan Note (Signed)
Seems to be euthyroid Will check labs 

## 2022-01-18 ENCOUNTER — Other Ambulatory Visit (INDEPENDENT_AMBULATORY_CARE_PROVIDER_SITE_OTHER): Payer: 59

## 2022-01-18 DIAGNOSIS — E039 Hypothyroidism, unspecified: Secondary | ICD-10-CM | POA: Diagnosis not present

## 2022-01-18 DIAGNOSIS — I10 Essential (primary) hypertension: Secondary | ICD-10-CM | POA: Diagnosis not present

## 2022-01-18 DIAGNOSIS — Z125 Encounter for screening for malignant neoplasm of prostate: Secondary | ICD-10-CM

## 2022-01-18 LAB — COMPREHENSIVE METABOLIC PANEL
ALT: 20 U/L (ref 0–53)
AST: 13 U/L (ref 0–37)
Albumin: 3.8 g/dL (ref 3.5–5.2)
Alkaline Phosphatase: 50 U/L (ref 39–117)
BUN: 17 mg/dL (ref 6–23)
CO2: 36 mEq/L — ABNORMAL HIGH (ref 19–32)
Calcium: 8.8 mg/dL (ref 8.4–10.5)
Chloride: 98 mEq/L (ref 96–112)
Creatinine, Ser: 1.1 mg/dL (ref 0.40–1.50)
GFR: 78.42 mL/min (ref >60.00)
Glucose, Bld: 108 mg/dL — ABNORMAL HIGH (ref 70–99)
Potassium: 3.6 mEq/L (ref 3.5–5.1)
Sodium: 140 mEq/L (ref 135–145)
Total Bilirubin: 0.8 mg/dL (ref 0.2–1.2)
Total Protein: 5.9 g/dL — ABNORMAL LOW (ref 6.0–8.3)

## 2022-01-18 LAB — LIPID PANEL
Cholesterol: 181 mg/dL (ref 0–200)
HDL: 44.3 mg/dL (ref >39.00)
NonHDL: 136.44
Total CHOL/HDL Ratio: 4
Triglycerides: 260 mg/dL — ABNORMAL HIGH (ref 0.0–149.0)
VLDL: 52 mg/dL — ABNORMAL HIGH (ref 0.0–40.0)

## 2022-01-18 LAB — CBC
HCT: 45.9 % (ref 39.0–52.0)
Hemoglobin: 15.8 g/dL (ref 13.0–17.0)
MCHC: 34.5 g/dL (ref 30.0–36.0)
MCV: 95.4 fl (ref 78.0–100.0)
Platelets: 221 10*3/uL (ref 150.0–400.0)
RBC: 4.81 Mil/uL (ref 4.22–5.81)
RDW: 13.4 % (ref 11.5–15.5)
WBC: 7.8 10*3/uL (ref 4.0–10.5)

## 2022-01-18 LAB — PSA: PSA: 0.71 ng/mL (ref 0.10–4.00)

## 2022-01-18 LAB — T4, FREE: Free T4: 1.01 ng/dL (ref 0.60–1.60)

## 2022-01-18 LAB — TSH: TSH: 0.38 u[IU]/mL (ref 0.35–5.50)

## 2022-01-18 LAB — LDL CHOLESTEROL, DIRECT: Direct LDL: 86 mg/dL

## 2022-01-26 ENCOUNTER — Encounter: Payer: 59 | Admitting: Internal Medicine

## 2022-02-01 ENCOUNTER — Telehealth: Payer: Self-pay | Admitting: Internal Medicine

## 2022-02-01 NOTE — Telephone Encounter (Signed)
Pt continues to have long Covid issues. He is asking about FMLA/STD. I suggested he schedule an OV so more documentation can be done. Dr Silvio Pate can authorize the forms once pt gets them to [place]. Dr Silvio Pate is out of the office tomorrow so he is set up with Dr Glori Bickers.

## 2022-02-01 NOTE — Telephone Encounter (Signed)
Patient called in and wanted to speak with Larene Beach in regards to him having Covid and some follow up information. He can be reached at [phone]. Thank you!

## 2022-02-02 ENCOUNTER — Encounter: Payer: Self-pay | Admitting: Family Medicine

## 2022-02-02 ENCOUNTER — Ambulatory Visit (INDEPENDENT_AMBULATORY_CARE_PROVIDER_SITE_OTHER)
Admission: RE | Admit: 2022-02-02 | Discharge: 2022-02-02 | Disposition: A | Discharge status: Home / Self Care | Payer: 59 | Source: Ambulatory Visit | Attending: Family Medicine | Admitting: Family Medicine

## 2022-02-02 ENCOUNTER — Ambulatory Visit: Payer: 59 | Admitting: Family Medicine

## 2022-02-02 VITALS — BP 148/92 | HR 86 | Temp 97.7°F | Ht 70.0 in | Wt 232.5 lb

## 2022-02-02 DIAGNOSIS — U099 Post covid-19 condition, unspecified: Secondary | ICD-10-CM | POA: Insufficient documentation

## 2022-02-02 DIAGNOSIS — R918 Other nonspecific abnormal finding of lung field: Secondary | ICD-10-CM | POA: Diagnosis not present

## 2022-02-02 DIAGNOSIS — R058 Other specified cough: Secondary | ICD-10-CM | POA: Insufficient documentation

## 2022-02-02 MED ORDER — PROMETHAZINE-DM 6.25-15 MG/5ML PO SYRP: 5.0000 mL | ORAL_SOLUTION | Freq: Three times a day (TID) | ORAL | 0 refills | Status: DC | PRN

## 2022-02-02 MED ORDER — AMOXICILLIN-POT CLAVULANATE 875-125 MG PO TABS: 1.0000 | ORAL_TABLET | Freq: Two times a day (BID) | ORAL | 0 refills | Status: DC

## 2022-02-02 MED ORDER — AZITHROMYCIN 250 MG PO TABS: ORAL_TABLET | ORAL | 0 refills | Status: DC

## 2022-02-02 NOTE — Patient Instructions (Addendum)
Stay out of work for the next week   Chest xray today   Rest when you can  Eat healthy /drink fluids    I will get info to Dr Silvio Pate and then make a plan from there

## 2022-02-02 NOTE — Progress Notes (Unsigned)
   Subjective:    Patient ID: Gregory [name], male    DOB: [birth year], 52 y.o.   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 [place] next week  He called in addn support  Then no travel for a month   Got covid in early December  Took paxlovid  Had to take an oral steroid   Whole holiday season was horrible   Fatigue in day Cannot sleep at night   Diet is good- eats a healthy diet  Appetite comes and goes (steroids made him hungry)  Eats fruit and vegetables , some protein smoothies with lots of produce added  Lost taste and smell- taste is coming back a bit but not normal , can only smells strong smells   Brain fog- severe Writes out massive to do list  Cannot keep up   Not a lot of headache  Occ shooting pains in his ears / fleeting   Body aches: all over  Occ ibuprofen- likes to avoid it however/makes him constipated   Temp is 97 to 98 -- does not go above A little more cold natured than usual  Lab Results  Component Value Date   TSH 0.38 01/18/2022    Cough causing chest pain  Cough is occasional - over a month  Chest is really sore  Has not had a chest xray   Watery eyes- wakes up with some crust   Feels shaky: his anxiety may be a bit worse   Has OSA    Takes lexapro 10 mg for mood Xanax for flying   HTN Triam-hctz 37.5-25 mg daily  Has not taken it his med this am yet and bp is up  Has been  very well controlled   Lab Results  Component Value Date   WBC 7.8 01/18/2022   HGB 15.8 01/18/2022   HCT 45.9 01/18/2022   MCV 95.4 01/18/2022   PLT 221.0 01/18/2022    Had covid once before 2 years ago Had to get antibody infusion/ was very sick but not hospitalized  Last covid imm was nov 2021   Takes some day quil during the day  Occ ny quil or the promethazine cough med   Some melatonin  Review of Systems     Objective:   Physical Exam        Assessment & Plan:

## 2022-02-04 NOTE — Assessment & Plan Note (Signed)
Post covid R perihilar  Treated for CAP with augmentin and azithromycin   Will watch clinical status ER precautions note

## 2022-02-04 NOTE — Assessment & Plan Note (Signed)
There is inflitrate on his cxr -will tx for CAP Prometh dm sent to pharmacy for cough (will see if this is available)   Update if not starting to improve in a week or if worsening

## 2022-02-04 NOTE — Assessment & Plan Note (Signed)
With cough, chest discomfort Brain fog, occ headache Body aches but no fever  Worsened anxiety   Infiltrate noted on cxr  Work note for 1 week Disc sympt control and self care   May need as long as a month off work  Will d/w pcp  He will send for std, fmla papwerwork

## 2022-02-06 ENCOUNTER — Telehealth: Payer: Self-pay

## 2022-02-06 NOTE — Telephone Encounter (Signed)
-----  Message from Abner Greenspan, MD sent at 02/04/2022  1:51 PM EST ----- Please call pt to schedule a follow up with Dr Silvio Pate next week for pneumonia and post covid syndrome  Dr Silvio Pate preferred later in week if possible Thanks

## 2022-02-06 NOTE — Telephone Encounter (Signed)
Spoke to pt. He wanted to keep the appt he has on 02-12-22.

## 2022-02-12 ENCOUNTER — Encounter: Payer: Self-pay | Admitting: Internal Medicine

## 2022-02-12 ENCOUNTER — Ambulatory Visit: Payer: 59 | Admitting: Internal Medicine

## 2022-02-12 VITALS — BP 122/80 | HR 68 | Temp 97.4°F | Ht 70.0 in | Wt 233.0 lb

## 2022-02-12 DIAGNOSIS — U099 Post covid-19 condition, unspecified: Secondary | ICD-10-CM

## 2022-02-12 MED ORDER — ESCITALOPRAM OXALATE 20 MG PO TABS: 20.0000 mg | ORAL_TABLET | Freq: Every day | ORAL | 3 refills | Status: DC

## 2022-02-12 NOTE — Progress Notes (Signed)
Subjective:    Patient ID: Gregory [name], male    DOB: Mar 02, 1970, 52 y.o.   MRN: [id number]  HPI Here due to ongoing issues with long COVID  Did feel the antibiotics helped his chest Less cough and sputum  Still with fatigue and brain fog "I can think--I can't function" Depression and anxiety---"like I have never felt like in my life"  Has reviewed situation with occupational nurse They encouraged him to take full STD that he has (12 weeks)  Physical limitations due to DOE--steps or extended walking Has to sit while showering even Cough persists but is better  Current Outpatient Medications on File Prior to Visit  Medication Sig Dispense Refill   ALPRAZolam (XANAX) 0.25 MG tablet Take 1 tablet (0.25 mg total) by mouth 2 (two) times daily as needed. 30 tablet 0   escitalopram (LEXAPRO) 10 MG tablet Take 1 tablet (10 mg total) by mouth daily. 90 tablet 3   fluticasone (FLONASE) 50 MCG/ACT nasal spray Place 2 sprays into both nostrils daily.     levothyroxine (SYNTHROID) 125 MCG tablet Take 1 tablet (125 mcg total) by mouth daily. 90 tablet 3   Melatonin 3 MG CAPS Take 1 capsule by mouth daily as needed.     Probiotic Product (PROBIOTIC PO) Take by mouth.     promethazine-dextromethorphan (PROMETHAZINE-DM) 6.25-15 MG/5ML syrup Take 5 mLs by mouth 3 (three) times daily as needed for cough. Caution of sedation 150 mL 0   triamterene-hydrochlorothiazide (MAXZIDE-25) 37.5-25 MG tablet Take 1 tablet by mouth daily. 90 tablet 3   No current facility-administered medications on file prior to visit.    Allergies  Allergen Reactions   Sulfasalazine Anaphylaxis and Rash   Gluten Meal    Sulfa Antibiotics     Past Medical History:  Diagnosis Date   Hypertension    Hypothyroidism, adult    Mood disorder ([place])    Non-celiac gluten sensitivity    Obstructive sleep apnea    Better with CPAP   Osteoarthritis of multiple joints    knees and ankles    Past Surgical History:   Procedure Laterality Date   NO PAST SURGERIES      Family History  Problem Relation Age of Onset   Heart disease Mother        valve repair   Breast cancer Mother    Diabetes Father    Obesity Father    Prostate cancer Maternal Uncle    Colon cancer Paternal Uncle    Diabetes Paternal Grandmother     Social History   Socioeconomic History   Marital status: Married    Spouse name: Not on file   Number of children: 3   Years of education: Not on file   Highest education level: Not on file  Occupational History   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  Tobacco Use   Smoking status: Former    Types: Cigars    Passive exposure: Past   Smokeless tobacco: Never   Tobacco comments:    1 every 1-2 months  Vaping Use   Vaping Use: Some days  Substance and Sexual Activity   Alcohol use: Yes   Drug use: No   Sexual activity: Not on file  Other Topics Concern   Not on file  Social History Narrative   Not on file   Social Determinants of Health   Financial Resource Strain: Not on file  Food Insecurity: Not on file  Transportation Needs: Not on  file  Physical Activity: Not on file  Stress: Not on file  Social Connections: Not on file  Intimate Partner Violence: Not on file   Review of Systems Not sleeping well Appetite is variable--0kay at times Has lost about 15# throughout all this    Objective:   Physical Exam Constitutional:      Appearance: Normal appearance.  Neurological:     Mental Status: He is alert.  Psychiatric:        Mood and Affect: Mood normal.        Behavior: Behavior normal.            Assessment & Plan:

## 2022-02-12 NOTE — Assessment & Plan Note (Signed)
Improved cough but still DOE Significant functional problems, brain fog, etc Depression and anxiety are worse---will increase the escitalopram to 20mg  daily Short term disability-- started 1/29 Is eligible for 12 weeks See back in 2 months---consider MOCA then if ongoing "fog"

## 2022-02-21 ENCOUNTER — Encounter: Payer: Self-pay | Admitting: Internal Medicine

## 2022-02-28 ENCOUNTER — Encounter: Payer: Self-pay | Admitting: Internal Medicine

## 2022-02-28 ENCOUNTER — Telehealth: Payer: Self-pay

## 2022-02-28 NOTE — Telephone Encounter (Signed)
Patient sent in a copy of his FMLA form that needs to be filled out for Covid symptoms.  Due date 03/02/22.  This has been placed in your inbox for completion and signing.  Requested continuous leave from 02/05/22-04/30/22.  When complete, patient would like his copy mailed to [email or web address], wants to be attached to the email that I send to his company for confirmation of receipt.

## 2022-03-01 DIAGNOSIS — Z0279 Encounter for issue of other medical certificate: Secondary | ICD-10-CM

## 2022-03-01 NOTE — Telephone Encounter (Signed)
Completed form faxed to [phone].  Received fax confirmation.  Patient's copy emailed to [email or web address].  Copies made for scanning, billing, and myself.

## 2022-03-08 ENCOUNTER — Telehealth: Payer: Self-pay

## 2022-03-08 NOTE — Telephone Encounter (Signed)
We received a short term disability form from patient today.  This is due today.  There are a couple questions that still need to be answered.  This form has been placed in your inbox for completion and signing.

## 2022-03-08 NOTE — Telephone Encounter (Signed)
Form done 

## 2022-03-08 NOTE — Telephone Encounter (Signed)
Completed form has been emailed to [email or web address] and copied to [email or web address].  Copies made for scanning and myself.

## 2022-04-17 ENCOUNTER — Encounter: Payer: Self-pay | Admitting: Internal Medicine

## 2022-04-17 ENCOUNTER — Ambulatory Visit: Payer: 59 | Admitting: Internal Medicine

## 2022-04-17 VITALS — BP 110/78 | HR 70 | Temp 97.4°F | Ht 70.0 in | Wt 234.0 lb

## 2022-04-17 DIAGNOSIS — F39 Unspecified mood [affective] disorder: Secondary | ICD-10-CM | POA: Diagnosis not present

## 2022-04-17 DIAGNOSIS — U099 Post covid-19 condition, unspecified: Secondary | ICD-10-CM

## 2022-04-17 MED ORDER — ESCITALOPRAM OXALATE 10 MG PO TABS: 10.0000 mg | ORAL_TABLET | Freq: Every day | ORAL | 3 refills | Status: DC

## 2022-04-17 NOTE — Assessment & Plan Note (Signed)
Improved with physical improvement Will go back to escitalopram 10mg  daily

## 2022-04-17 NOTE — Assessment & Plan Note (Signed)
Respiratory symptoms are better Still fatigues easy--and mild brain fog Ready to go back to work---signed paper for return 4/22 without restrictions Recommended he get FMLA to allow 1 day a week off if his fatigue worsens

## 2022-04-17 NOTE — Progress Notes (Signed)
Subjective:    Patient ID: Gregory [name], male    DOB: Dec 11, 1970, 52 y.o.   MRN: [id number]  HPI Here for follow up of disability from long COVID  Has been working on rehab Walking slowly 2 miles four days a week Doing incentive spirometry Breathing is better Cough is pretty much gone  Still tired Some brain fog--like remembering what he did a couple of days ago Fatigues--like trying to do something out in the yard (has to be careful not to overdo it) No sig pain  Mood is better Went back down on the lexapro---lethargic on the higher dose  Current Outpatient Medications on File Prior to Visit  Medication Sig Dispense Refill   ALPRAZolam (XANAX) 0.25 MG tablet Take 1 tablet (0.25 mg total) by mouth 2 (two) times daily as needed. 30 tablet 0   escitalopram (LEXAPRO) 20 MG tablet Take 1 tablet (20 mg total) by mouth daily. (Patient taking differently: Take 10 mg by mouth daily.) 90 tablet 3   fluticasone (FLONASE) 50 MCG/ACT nasal spray Place 2 sprays into both nostrils daily.     levothyroxine (SYNTHROID) 125 MCG tablet Take 1 tablet (125 mcg total) by mouth daily. 90 tablet 3   Melatonin 3 MG CAPS Take 1 capsule by mouth daily as needed.     Probiotic Product (PROBIOTIC PO) Take by mouth.     triamterene-hydrochlorothiazide (MAXZIDE-25) 37.5-25 MG tablet Take 1 tablet by mouth daily. 90 tablet 3   No current facility-administered medications on file prior to visit.    Allergies  Allergen Reactions   Sulfasalazine Anaphylaxis and Rash   Gluten Meal    Sulfa Antibiotics     Past Medical History:  Diagnosis Date   Hypertension    Hypothyroidism, adult    Mood disorder    Non-celiac gluten sensitivity    Obstructive sleep apnea    Better with CPAP   Osteoarthritis of multiple joints    knees and ankles    Past Surgical History:  Procedure Laterality Date   NO PAST SURGERIES      Family History  Problem Relation Age of Onset   Heart disease Mother         valve repair   Breast cancer Mother    Diabetes Father    Obesity Father    Prostate cancer Maternal Uncle    Colon cancer Paternal Uncle    Diabetes Paternal Grandmother     Social History   Socioeconomic History   Marital status: Married    Spouse name: Not on file   Number of children: 3   Years of education: Not on file   Highest education level: Bachelor's degree (e.g., BA, AB, BS)  Occupational History   Occupation: Outside Airline pilot    Comment: Pharmaceutical representative  Tobacco Use   Smoking status: Former    Types: Cigars    Passive exposure: Past   Smokeless tobacco: Never   Tobacco comments:    1 every 1-2 months  Vaping Use   Vaping Use: Some days  Substance and Sexual Activity   Alcohol use: Yes   Drug use: No   Sexual activity: Not on file  Other Topics Concern   Not on file  Social History Narrative   Not on file   Social Determinants of Health   Financial Resource Strain: Low Risk  (04/16/2022)   Overall Financial Resource Strain (CARDIA)    Difficulty of Paying Living Expenses: Not very hard  Food Insecurity: No Food Insecurity (  04/16/2022)   Hunger Vital Sign    Worried About Running Out of Food in the Last Year: Never true    Ran Out of Food in the Last Year: Never true  Transportation Needs: No Transportation Needs (04/16/2022)   PRAPARE - Administrator, Civil Service (Medical): No    Lack of Transportation (Non-Medical): No  Physical Activity: Sufficiently Active (04/16/2022)   Exercise Vital Sign    Days of Exercise per Week: 4 days    Minutes of Exercise per Session: 60 min  Stress: Stress Concern Present (04/16/2022)   [employer] of Occupational Health - Occupational Stress Questionnaire    Feeling of Stress : To some extent  Social Connections: Socially Integrated (04/16/2022)   Social Connection and Isolation Panel [NHANES]    Frequency of Communication with Friends and Family: More than three times a week    Frequency of  Social Gatherings with Friends and Family: Once a week    Attends Religious Services: More than 4 times per year    Active Member of [employer] or Organizations: Yes    Attends Engineer, structural: More than 4 times per year    Marital Status: Married  Catering manager Violence: Not on file   Review of Systems Sleeps fair--using melatonin an benedryl at night for allergies (melatonin alone not as helpful) Appetite is normal Weight is down some--from the walking (and less sugar)    Objective:   Physical Exam Constitutional:      Appearance: Normal appearance.  Neurological:     Mental Status: He is alert.  Psychiatric:        Mood and Affect: Mood normal.        Behavior: Behavior normal.            Assessment & Plan:

## 2022-07-25 ENCOUNTER — Encounter: Payer: Self-pay | Admitting: Internal Medicine

## 2023-01-15 ENCOUNTER — Ambulatory Visit (INDEPENDENT_AMBULATORY_CARE_PROVIDER_SITE_OTHER): Payer: 59 | Admitting: Internal Medicine

## 2023-01-15 ENCOUNTER — Other Ambulatory Visit: Payer: Self-pay | Admitting: Internal Medicine

## 2023-01-15 ENCOUNTER — Encounter: Payer: Self-pay | Admitting: Internal Medicine

## 2023-01-15 VITALS — BP 104/78 | HR 63 | Temp 98.1°F | Ht 70.5 in | Wt 235.0 lb

## 2023-01-15 DIAGNOSIS — Z125 Encounter for screening for malignant neoplasm of prostate: Secondary | ICD-10-CM | POA: Diagnosis not present

## 2023-01-15 DIAGNOSIS — Z1159 Encounter for screening for other viral diseases: Secondary | ICD-10-CM | POA: Diagnosis not present

## 2023-01-15 DIAGNOSIS — F39 Unspecified mood [affective] disorder: Secondary | ICD-10-CM

## 2023-01-15 DIAGNOSIS — K9041 Non-celiac gluten sensitivity: Secondary | ICD-10-CM

## 2023-01-15 DIAGNOSIS — Z0000 Encounter for general adult medical examination without abnormal findings: Secondary | ICD-10-CM

## 2023-01-15 DIAGNOSIS — E039 Hypothyroidism, unspecified: Secondary | ICD-10-CM | POA: Diagnosis not present

## 2023-01-15 DIAGNOSIS — I10 Essential (primary) hypertension: Secondary | ICD-10-CM

## 2023-01-15 DIAGNOSIS — G4733 Obstructive sleep apnea (adult) (pediatric): Secondary | ICD-10-CM

## 2023-01-15 DIAGNOSIS — R739 Hyperglycemia, unspecified: Secondary | ICD-10-CM

## 2023-01-15 LAB — COMPREHENSIVE METABOLIC PANEL
ALT: 32 U/L (ref 0–53)
AST: 20 U/L (ref 0–37)
Albumin: 4.6 g/dL (ref 3.5–5.2)
Alkaline Phosphatase: 63 U/L (ref 39–117)
BUN: 15 mg/dL (ref 6–23)
CO2: 32 meq/L (ref 19–32)
Calcium: 9.5 mg/dL (ref 8.4–10.5)
Chloride: 102 meq/L (ref 96–112)
Creatinine, Ser: 1 mg/dL (ref 0.40–1.50)
GFR: 87.32 mL/min (ref >60.00)
Glucose, Bld: 117 mg/dL — ABNORMAL HIGH (ref 70–99)
Potassium: 3.7 meq/L (ref 3.5–5.1)
Sodium: 141 meq/L (ref 135–145)
Total Bilirubin: 1 mg/dL (ref 0.2–1.2)
Total Protein: 6.7 g/dL (ref 6.0–8.3)

## 2023-01-15 LAB — LIPID PANEL
Cholesterol: 226 mg/dL — ABNORMAL HIGH (ref 0–200)
HDL: 52.9 mg/dL (ref >39.00)
LDL Cholesterol: 146 mg/dL — ABNORMAL HIGH (ref 0–99)
NonHDL: 172.64
Total CHOL/HDL Ratio: 4
Triglycerides: 135 mg/dL (ref 0.0–149.0)
VLDL: 27 mg/dL (ref 0.0–40.0)

## 2023-01-15 LAB — CBC
HCT: 49.1 % (ref 39.0–52.0)
Hemoglobin: 16.9 g/dL (ref 13.0–17.0)
MCHC: 34.5 g/dL (ref 30.0–36.0)
MCV: 95.8 fL (ref 78.0–100.0)
Platelets: 243 10*3/uL (ref 150.0–400.0)
RBC: 5.13 Mil/uL (ref 4.22–5.81)
RDW: 13.2 % (ref 11.5–15.5)
WBC: 8.1 10*3/uL (ref 4.0–10.5)

## 2023-01-15 LAB — HEMOGLOBIN A1C: Hgb A1c MFr Bld: 5.7 % (ref 4.6–6.5)

## 2023-01-15 LAB — T4, FREE: Free T4: 0.82 ng/dL (ref 0.60–1.60)

## 2023-01-15 LAB — PSA: PSA: 0.94 ng/mL (ref 0.10–4.00)

## 2023-01-15 LAB — TSH: TSH: 0.33 u[IU]/mL — ABNORMAL LOW (ref 0.35–5.50)

## 2023-01-15 MED ORDER — ALPRAZOLAM 0.25 MG PO TABS: 0.2500 mg | ORAL_TABLET | Freq: Two times a day (BID) | ORAL | 0 refills | Status: DC | PRN

## 2023-01-15 MED ORDER — LEVOTHYROXINE SODIUM 125 MCG PO TABS: 125.0000 ug | ORAL_TABLET | Freq: Every day | ORAL | 3 refills | Status: DC

## 2023-01-15 MED ORDER — ESCITALOPRAM OXALATE 10 MG PO TABS: 10.0000 mg | ORAL_TABLET | Freq: Every day | ORAL | 3 refills | Status: DC

## 2023-01-15 MED ORDER — TRIAMTERENE-HCTZ 37.5-25 MG PO TABS: 1.0000 | ORAL_TABLET | Freq: Every day | ORAL | 3 refills | Status: DC

## 2023-01-15 NOTE — Assessment & Plan Note (Signed)
 Seems euthyroid on levothyroxine 125

## 2023-01-15 NOTE — Assessment & Plan Note (Signed)
 BP Readings from Last 3 Encounters:  01/15/23 104/78  04/17/22 110/78  02/12/22 122/80   Controlled on the maxzide 25

## 2023-01-15 NOTE — Assessment & Plan Note (Signed)
 Mild persistent anxiety controlled with escitalopram 10 Xanax for flying

## 2023-01-15 NOTE — Assessment & Plan Note (Signed)
 Uses CPAP daily

## 2023-01-15 NOTE — Progress Notes (Signed)
 Subjective:    Patient ID: Gregory [name], male    DOB: 11-Feb-1970, 53 y.o.   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 30 tablet 0   escitalopram  (LEXAPRO ) 10 MG tablet Take 1 tablet (10 mg total) by mouth daily. 90 tablet 3   fluticasone (FLONASE) 50 MCG/ACT nasal spray Place 2 sprays into both nostrils daily.     levothyroxine  (SYNTHROID ) 125 MCG tablet Take 1 tablet (125 mcg total) by mouth daily. 90 tablet 3   Melatonin 3 MG CAPS Take 1 capsule by mouth daily as needed.     Probiotic Product (PROBIOTIC PO) Take by mouth.     triamterene -hydrochlorothiazide (MAXZIDE-25) 37.5-25 MG tablet Take 1 tablet by mouth daily. 90 tablet 3   No current facility-administered medications on file prior to visit.    Allergies  Allergen Reactions   Sulfasalazine Anaphylaxis and Rash   Gluten Meal    Sulfa Antibiotics     Past Medical History:  Diagnosis Date   Hypertension    Hypothyroidism, adult    Mood disorder ([facility])    Non-celiac gluten sensitivity    Obstructive sleep apnea    Better with CPAP   Osteoarthritis of multiple joints    knees and ankles    Past Surgical History:  Procedure Laterality Date   NO PAST SURGERIES      Family History  Problem Relation Age of Onset   Heart disease Mother        valve repair   Breast cancer  Mother    Diabetes Father    Obesity Father    Prostate cancer Maternal Uncle    Colon cancer Paternal Uncle    Diabetes Paternal Grandmother     Social History   Socioeconomic History   Marital status: Married    Spouse name: Not on file   Number of children: 3   Years of education: Not on file   Highest education level: Bachelor's degree (e.g., BA, AB, BS)  Occupational History   Occupation: Outside airline pilot    Comment: Pharmaceutical representative  Tobacco Use   Smoking status: Former    Types: Cigars    Passive exposure: Past   Smokeless tobacco: Never   Tobacco comments:    1 every 1-2 months  Vaping Use   Vaping status: Some Days  Substance and Sexual Activity   Alcohol use: Yes   Drug use: No   Sexual activity: Not on file  Other Topics Concern   Not on file  Social History Narrative   Not on file   Social Drivers of Health   Financial Resource Strain: Low Risk  (04/16/2022)   Overall Financial Resource Strain (CARDIA)    Difficulty of Paying Living Expenses: Not very hard  Food Insecurity: No Food Insecurity (04/16/2022)   Hunger Vital Sign    Worried About Running  Out of Food in the Last Year: Never true    Ran Out of Food in the Last Year: Never true  Transportation Needs: No Transportation Needs (04/16/2022)   PRAPARE - Administrator, Civil Service (Medical): No    Lack of Transportation (Non-Medical): No  Physical Activity: Sufficiently Active (04/16/2022)   Exercise Vital Sign    Days of Exercise per Week: 4 days    Minutes of Exercise per Session: 60 min  Stress: Stress Concern Present (04/16/2022)   [employer] of Occupational Health - Occupational Stress Questionnaire    Feeling of Stress : To some extent  Social Connections: Socially Integrated (04/16/2022)   Social Connection and Isolation Panel [NHANES]    Frequency of Communication with Friends and Family: More than three times a week    Frequency of Social Gatherings with Friends  and Family: Once a week    Attends Religious Services: More than 4 times per year    Active Member of [employer] or Organizations: Yes    Attends Banker Meetings: More than 4 times per year    Marital Status: Married  Catering Manager Violence: Unknown (04/11/2021)   Received from [facility], [facility]   HITS    Physically Hurt: Not on file    Insult or Talk Down To: Not on file    Threaten Physical Harm: Not on file    Scream or Curse: Not on file   Review of Systems  Constitutional:  Negative for unexpected weight change.       Wears seat belt  HENT:  Negative for dental problem, hearing loss and tinnitus.        Keeps up with dentist  Eyes:  Negative for visual disturbance.       No diplopia or unilateral vision loss  Respiratory:  Negative for cough, chest tightness and shortness of breath.   Cardiovascular:  Negative for chest pain.       Some palpitations with anxiety  Gastrointestinal:  Negative for blood in stool and constipation.       No heartburn  Endocrine: Negative for polydipsia and polyuria.  Genitourinary:  Negative for urgency.       Slight dribbling No sexual problems  Musculoskeletal:  Negative for arthralgias and joint swelling.       Some back pain due to increased time driving---trying yoga  Skin:  Negative for rash.       Plans to go to derm  Allergic/Immunologic: Positive for environmental allergies. Negative for immunocompromised state.       Uses flonase --and OTC antihistamines seasonally  Neurological:  Negative for dizziness, syncope and headaches.       One spell of lightheadedness--? With the COVID  Hematological:  Negative for adenopathy. Does not bruise/bleed easily.  Psychiatric/Behavioral:  Negative for dysphoric mood. The patient is nervous/anxious.        Sleeps well with the CPAP--uses every night       Objective:   Physical Exam Constitutional:      Appearance: Normal appearance.  HENT:     Mouth/Throat:      Pharynx: No oropharyngeal exudate or posterior oropharyngeal erythema.  Eyes:     Conjunctiva/sclera: Conjunctivae normal.     Pupils: Pupils are equal, round, and reactive to light.  Cardiovascular:     Rate and Rhythm: Normal rate and regular rhythm.     Pulses: Normal pulses.     Heart sounds: No murmur heard.    No  gallop.  Pulmonary:     Effort: Pulmonary effort is normal.     Breath sounds: Normal breath sounds. No wheezing or rales.  Abdominal:     Palpations: Abdomen is soft.     Tenderness: There is no abdominal tenderness.  Musculoskeletal:     Cervical back: Neck supple.     Right lower leg: No edema.     Left lower leg: No edema.  Lymphadenopathy:     Cervical: No cervical adenopathy.  Skin:    Findings: No lesion or rash.  Neurological:     General: No focal deficit present.     Mental Status: He is alert and oriented to person, place, and time.  Psychiatric:        Mood and Affect: Mood normal.        Behavior: Behavior normal.            Assessment & Plan:

## 2023-01-15 NOTE — Patient Instructions (Signed)
 Please set up to get a shingles vaccine several months before next year's physical (and then you can get the second at your physical)

## 2023-01-15 NOTE — Assessment & Plan Note (Signed)
 Healthy Working on fitness PSA due to FH Colon due again 2028 Had flu/COVID vaccines Will get shingrix in the next year--just not today

## 2023-01-16 ENCOUNTER — Encounter: Payer: Self-pay | Admitting: Internal Medicine

## 2023-01-16 ENCOUNTER — Other Ambulatory Visit: Payer: Self-pay | Admitting: Internal Medicine

## 2023-01-16 LAB — HEPATITIS C ANTIBODY: Hepatitis C Ab: NONREACTIVE

## 2023-02-14 ENCOUNTER — Encounter: Payer: Self-pay | Admitting: Internal Medicine

## 2023-02-15 MED ORDER — ALPRAZOLAM 0.25 MG PO TABS: 0.2500 mg | ORAL_TABLET | Freq: Two times a day (BID) | ORAL | 0 refills | Status: DC | PRN

## 2023-02-15 MED ORDER — LEVOTHYROXINE SODIUM 125 MCG PO TABS: 125.0000 ug | ORAL_TABLET | Freq: Every day | ORAL | 3 refills | Status: DC

## 2023-02-18 ENCOUNTER — Telehealth: Payer: Self-pay

## 2023-02-18 NOTE — Telephone Encounter (Signed)
 Copied from CRM [id number]. Topic: Clinical - Prescription Issue >> Feb 18, 2023  2:32 PM Martinique E wrote: Reason for CRM: Fabian Holster from the [facility] called and told agent that patient's ALPRAZolam  (XANAX ) 0.25 MG tablet was lost in transit and he is now out of refills. Pharmacist was requesting another refill with the note "Approved for early fill" from provider. Callback number for Fabian Holster is [phone], just to confirm.

## 2023-02-19 NOTE — Telephone Encounter (Signed)
Spoke with pt and he is aware that Dr. Alphonsus Sias refilled his prescription locally. Nothing further was needed.

## 2023-05-14 ENCOUNTER — Other Ambulatory Visit (HOSPITAL_COMMUNITY): Payer: Self-pay

## 2023-05-14 ENCOUNTER — Encounter: Payer: Self-pay | Admitting: Internal Medicine

## 2023-05-14 ENCOUNTER — Telehealth: Payer: Self-pay

## 2023-05-14 ENCOUNTER — Ambulatory Visit: Admitting: Internal Medicine

## 2023-05-14 VITALS — BP 122/80 | HR 56 | Temp 98.1°F | Ht 70.5 in | Wt 243.0 lb

## 2023-05-14 DIAGNOSIS — E66811 Obesity, class 1: Secondary | ICD-10-CM

## 2023-05-14 DIAGNOSIS — E039 Hypothyroidism, unspecified: Secondary | ICD-10-CM

## 2023-05-14 MED ORDER — WEGOVY 0.25 MG/0.5ML ~~LOC~~ SOAJ: 0.2500 mg | SUBCUTANEOUS | 1 refills | Status: DC

## 2023-05-14 MED ORDER — TRIAMTERENE-HCTZ 37.5-25 MG PO TABS: 1.0000 | ORAL_TABLET | Freq: Every day | ORAL | 3 refills | Status: DC

## 2023-05-14 MED ORDER — LEVOTHYROXINE SODIUM 112 MCG PO TABS
112.0000 ug | ORAL_TABLET | Freq: Every day | ORAL | 3 refills | Status: DC
Start: 2023-05-14 — End: 2023-10-11

## 2023-05-14 NOTE — Telephone Encounter (Signed)
 Pharmacy Patient Advocate Encounter  Received notification from [facility] that Prior Authorization for [facility] 0.25MG /0.5ML auto-injectors has been APPROVED from 05/14/2023 to 12/14/2023. Unable to obtain price due to refill too soon rejection, last fill date 05/14/2023 next available fill date05/28/2025   PA #/Case ID/Reference #: [id number] SA

## 2023-05-14 NOTE — Assessment & Plan Note (Signed)
 Multiple comorbidities----post COVID syndrome, sleep apnea, osteoarthritis, hypertension He is doing all he can with lifestyle Discussed pros/cons with GLP-1 agonists---he would like to try Rx wegovy 0.25mg  weekly x 4 then titrate

## 2023-05-14 NOTE — Progress Notes (Signed)
 Subjective:    Patient ID: Gregory [name], male    DOB: Jun 22, 1970, 53 y.o.   MRN: [id number]  HPI Here with concerns about weight gain  Still some brain fog Gets SOB if walks more than 2 miles  Wife thinks he should try GLP-1  He also is frustrated by 40# that he has not been able to lose over many years The weight is causing increased arthritis pain Still needs CPAP---knows this all is due to his weight  Current Outpatient Medications on File Prior to Visit  Medication Sig Dispense Refill   ALPRAZolam  (XANAX ) 0.25 MG tablet Take 1 tablet (0.25 mg total) by mouth 2 (two) times daily as needed. 30 tablet 0   escitalopram  (LEXAPRO ) 10 MG tablet Take 1 tablet (10 mg total) by mouth daily. 90 tablet 3   fluticasone (FLONASE) 50 MCG/ACT nasal spray Place 2 sprays into both nostrils daily.     levothyroxine  (SYNTHROID ) 125 MCG tablet Take 1 tablet (125 mcg total) by mouth daily. 90 tablet 3   Melatonin 3 MG CAPS Take 1 capsule by mouth daily as needed.     Probiotic Product (PROBIOTIC PO) Take by mouth.     triamterene -hydrochlorothiazide (MAXZIDE-25) 37.5-25 MG tablet Take 1 tablet by mouth daily. 90 tablet 3   No current facility-administered medications on file prior to visit.    Allergies  Allergen Reactions   Sulfasalazine Anaphylaxis and Rash   Gluten Meal    Sulfa Antibiotics     Past Medical History:  Diagnosis Date   Hypertension    Hypothyroidism, adult    Mood disorder (HCC)    Non-celiac gluten sensitivity    Obstructive sleep apnea    Better with CPAP   Osteoarthritis of multiple joints    knees and ankles    Past Surgical History:  Procedure Laterality Date   NO PAST SURGERIES      Family History  Problem Relation Age of Onset   Heart disease Mother        valve repair   Breast cancer Mother    Diabetes Father    Obesity Father    Prostate cancer Maternal Uncle    Colon cancer Paternal Uncle    Diabetes Paternal Grandmother     Social  History   Socioeconomic History   Marital status: Married    Spouse name: Not on file   Number of children: 3   Years of education: Not on file   Highest education level: Bachelor's degree (e.g., BA, AB, BS)  Occupational History   Occupation: Outside Airline pilot    Comment: Pharmaceutical representative  Tobacco Use   Smoking status: Former    Types: Cigars    Passive exposure: Past   Smokeless tobacco: Never   Tobacco comments:    1 every 1-2 months  Vaping Use   Vaping status: Some Days  Substance and Sexual Activity   Alcohol use: Yes   Drug use: No   Sexual activity: Not on file  Other Topics Concern   Not on file  Social History Narrative   Not on file   Social Drivers of Health   Financial Resource Strain: Low Risk  (04/16/2022)   Overall Financial Resource Strain (CARDIA)    Difficulty of Paying Living Expenses: Not very hard  Food Insecurity: No Food Insecurity (04/16/2022)   Hunger Vital Sign    Worried About Running Out of Food in the Last Year: Never true    Ran Out of Food in the  Last Year: Never true  Transportation Needs: No Transportation Needs (04/16/2022)   PRAPARE - Administrator, Civil Service (Medical): No    Lack of Transportation (Non-Medical): No  Physical Activity: Sufficiently Active (04/16/2022)   Exercise Vital Sign    Days of Exercise per Week: 4 days    Minutes of Exercise per Session: 60 min  Stress: Stress Concern Present (04/16/2022)   [employer] of Occupational Health - Occupational Stress Questionnaire    Feeling of Stress : To some extent  Social Connections: Socially Integrated (04/16/2022)   Social Connection and Isolation Panel [NHANES]    Frequency of Communication with Friends and Family: More than three times a week    Frequency of Social Gatherings with Friends and Family: Once a week    Attends Religious Services: More than 4 times per year    Active Member of [employer] or Organizations: Yes    Attends Museum/gallery exhibitions officer: More than 4 times per year    Marital Status: Married  Catering manager Violence: Unknown (04/11/2021)   Received from [facility], [facility]   HITS    Physically Hurt: Not on file    Insult or Talk Down To: Not on file    Threaten Physical Harm: Not on file    Scream or Curse: Not on file   Review of Systems Has been eating more healthy Playing pickle ball and walking----10-15K steps daily Sweats at times--wonders about his thyroid  dose    Objective:   Physical Exam Constitutional:      Appearance: Normal appearance.  Neurological:     Mental Status: He is alert.  Psychiatric:        Mood and Affect: Mood normal.        Behavior: Behavior normal.            Assessment & Plan:

## 2023-05-14 NOTE — Telephone Encounter (Signed)
 Pharmacy Patient Advocate Encounter   Received notification from CoverMyMeds that prior authorization for [facility] 0.25MG /0.5ML auto-injectors is required/requested.   Insurance verification completed.   The patient is insured through [facility] .   Per test claim: PA required; PA submitted to above mentioned insurance via CoverMyMeds Key/confirmation #/EOC [id number] Status is pending

## 2023-05-14 NOTE — Assessment & Plan Note (Signed)
 Mildly low TSH suggests he may do better with a lower dose Will decrease to 112 mcg---recheck at next physical

## 2023-05-30 ENCOUNTER — Encounter: Payer: Self-pay | Admitting: Internal Medicine

## 2023-05-30 ENCOUNTER — Ambulatory Visit: Admitting: Internal Medicine

## 2023-05-30 VITALS — BP 120/80 | HR 66 | Temp 98.4°F | Ht 70.5 in | Wt 239.0 lb

## 2023-05-30 DIAGNOSIS — K625 Hemorrhage of anus and rectum: Secondary | ICD-10-CM | POA: Insufficient documentation

## 2023-05-30 NOTE — Assessment & Plan Note (Signed)
 No obvious lesion  Has stopped already and stool is heme negative If recurs, he should check back with his GI (colonoscopy 2023)

## 2023-05-30 NOTE — Progress Notes (Signed)
 Subjective:    Patient ID: Gregory [name], male    DOB: [birth year], 53 y.o.   MRN: [id number]  HPI Here due to blood in his stools  Hasn't started the wegovy --finally got it Then got cold so held off Tried nicotine 2mg  gum 4x/day for the long COVID syndrome Helped some at first---then 5 days ago, noted bright red blood in his stool So stopped the gum 2 days ago--and the blood is gone  Shows pictures---blood lining stool, red in bowl, and then what looked like small clot  No abdominal pain No N/V  Current Outpatient Medications on File Prior to Visit  Medication Sig Dispense Refill   ALPRAZolam  (XANAX ) 0.25 MG tablet Take 1 tablet (0.25 mg total) by mouth 2 (two) times daily as needed. 30 tablet 0   escitalopram  (LEXAPRO ) 10 MG tablet Take 1 tablet (10 mg total) by mouth daily. 90 tablet 3   fluticasone (FLONASE) 50 MCG/ACT nasal spray Place 2 sprays into both nostrils daily.     levothyroxine  (SYNTHROID ) 112 MCG tablet Take 1 tablet (112 mcg total) by mouth daily. 90 tablet 3   Melatonin 3 MG CAPS Take 1 capsule by mouth daily as needed.     Probiotic Product (PROBIOTIC PO) Take by mouth.     triamterene -hydrochlorothiazide (MAXZIDE-25) 37.5-25 MG tablet Take 1 tablet by mouth daily. 90 tablet 3   Semaglutide -Weight Management (WEGOVY ) 0.25 MG/0.5ML SOAJ Inject 0.25 mg into the skin once a week. (Patient not taking: Reported on 05/30/2023) 2 mL 1   No current facility-administered medications on file prior to visit.    Allergies  Allergen Reactions   Sulfasalazine Anaphylaxis and Rash   Gluten Meal    Sulfa Antibiotics     Past Medical History:  Diagnosis Date   Hypertension    Hypothyroidism, adult    Mood disorder (HCC)    Non-celiac gluten sensitivity    Obstructive sleep apnea    Better with CPAP   Osteoarthritis of multiple joints    knees and ankles    Past Surgical History:  Procedure Laterality Date   NO PAST SURGERIES      Family History  Problem  Relation Age of Onset   Heart disease Mother        valve repair   Breast cancer Mother    Diabetes Father    Obesity Father    Prostate cancer Maternal Uncle    Colon cancer Paternal Uncle    Diabetes Paternal Grandmother     Social History   Socioeconomic History   Marital status: Married    Spouse name: Not on file   Number of children: 3   Years of education: Not on file   Highest education level: Bachelor's degree (e.g., BA, AB, BS)  Occupational History   Occupation: Outside Airline pilot    Comment: Pharmaceutical representative  Tobacco Use   Smoking status: Former    Types: Cigars    Passive exposure: Past   Smokeless tobacco: Never   Tobacco comments:    1 every 1-2 months  Vaping Use   Vaping status: Some Days  Substance and Sexual Activity   Alcohol use: Yes   Drug use: No   Sexual activity: Not on file  Other Topics Concern   Not on file  Social History Narrative   Not on file   Social Drivers of Health   Financial Resource Strain: Low Risk  (04/16/2022)   Overall Financial Resource Strain (CARDIA)    Difficulty of Paying  Living Expenses: Not very hard  Food Insecurity: No Food Insecurity (04/16/2022)   Hunger Vital Sign    Worried About Running Out of Food in the Last Year: Never true    Ran Out of Food in the Last Year: Never true  Transportation Needs: No Transportation Needs (04/16/2022)   PRAPARE - Administrator, Civil Service (Medical): No    Lack of Transportation (Non-Medical): No  Physical Activity: Sufficiently Active (04/16/2022)   Exercise Vital Sign    Days of Exercise per Week: 4 days    Minutes of Exercise per Session: 60 min  Stress: Stress Concern Present (04/16/2022)   [employer] of Occupational Health - Occupational Stress Questionnaire    Feeling of Stress : To some extent  Social Connections: Socially Integrated (04/16/2022)   Social Connection and Isolation Panel [NHANES]    Frequency of Communication with Friends and  Family: More than three times a week    Frequency of Social Gatherings with Friends and Family: Once a week    Attends Religious Services: More than 4 times per year    Active Member of [employer] or Organizations: Yes    Attends Banker Meetings: More than 4 times per year    Marital Status: Married  Catering manager Violence: Unknown (04/11/2021)   Received from [employer]   HITS    Physically Hurt: Not on file    Insult or Talk Down To: Not on file    Threaten Physical Harm: Not on file    Scream or Curse: Not on file   Review of Systems Appetite is fine Bowels moving twice a day    Objective:   Physical Exam Constitutional:      Appearance: Normal appearance.  Abdominal:     General: There is no distension.     Palpations: Abdomen is soft.     Tenderness: There is no abdominal tenderness. There is no guarding or rebound.  Genitourinary:    Comments: Normal prostate No fissures or rectal lesions No mass in rectum--brown heme negative stool Neurological:     Mental Status: He is alert.            Assessment & Plan:

## 2023-06-13 ENCOUNTER — Encounter: Payer: Self-pay | Admitting: Internal Medicine

## 2023-06-14 ENCOUNTER — Other Ambulatory Visit: Payer: Self-pay | Admitting: Family

## 2023-06-14 MED ORDER — WEGOVY 0.5 MG/0.5ML ~~LOC~~ SOAJ: 0.5000 mg | SUBCUTANEOUS | 1 refills | Status: DC

## 2023-06-30 ENCOUNTER — Other Ambulatory Visit: Payer: Self-pay | Admitting: Internal Medicine

## 2023-08-12 ENCOUNTER — Telehealth: Payer: Self-pay

## 2023-08-12 NOTE — Telephone Encounter (Signed)
 Pt is scheduled with Dr Bennett. Lateral TOC due to Dr Jimmy retiring.

## 2023-08-12 NOTE — Telephone Encounter (Signed)
 Copied from CRM #[id number]. Topic: Appointments - Transfer of Care >> Aug 12, 2023 10:41 AM Lavanda D wrote: Pt is requesting to transfer FROM: Dr. Jimmy Pt is requesting to transfer TO: Dr. Bennett Reason for requested transfer: Dr. Jimmy retiring It is the responsibility of the team the patient would like to transfer to (Dr. Dr. Bennett) to reach out to the patient if for any reason this transfer is not acceptable.   ----------------------------------------------------------------------- From previous Reason for Contact - Scheduling: Patient/patient representative is calling to schedule an appointment. Refer to attachments for appointment information.

## 2023-08-18 ENCOUNTER — Other Ambulatory Visit: Payer: Self-pay | Admitting: Family

## 2023-08-19 ENCOUNTER — Other Ambulatory Visit: Payer: Self-pay | Admitting: Internal Medicine

## 2023-08-19 MED ORDER — WEGOVY 0.5 MG/0.5ML ~~LOC~~ SOAJ: 0.5000 mg | SUBCUTANEOUS | 3 refills | Status: DC

## 2023-10-11 ENCOUNTER — Ambulatory Visit

## 2023-10-11 ENCOUNTER — Ambulatory Visit: Payer: Self-pay

## 2023-10-11 VITALS — BP 122/80 | HR 68 | Temp 98.0°F | Ht 70.5 in | Wt 200.0 lb

## 2023-10-11 DIAGNOSIS — E039 Hypothyroidism, unspecified: Secondary | ICD-10-CM

## 2023-10-11 DIAGNOSIS — R0602 Shortness of breath: Secondary | ICD-10-CM | POA: Diagnosis not present

## 2023-10-11 DIAGNOSIS — F419 Anxiety disorder, unspecified: Secondary | ICD-10-CM | POA: Insufficient documentation

## 2023-10-11 DIAGNOSIS — E66811 Obesity, class 1: Secondary | ICD-10-CM | POA: Diagnosis not present

## 2023-10-11 DIAGNOSIS — R7303 Prediabetes: Secondary | ICD-10-CM | POA: Insufficient documentation

## 2023-10-11 LAB — TSH: TSH: 0.09 u[IU]/mL — ABNORMAL LOW (ref 0.35–5.50)

## 2023-10-11 LAB — HEMOGLOBIN A1C: Hgb A1c MFr Bld: 5.3 % (ref 4.6–6.5)

## 2023-10-11 MED ORDER — LEVOTHYROXINE SODIUM 100 MCG PO TABS: 100.0000 ug | ORAL_TABLET | Freq: Every day | ORAL | 1 refills | Status: DC

## 2023-10-11 MED ORDER — WEGOVY 0.25 MG/0.5ML ~~LOC~~ SOAJ: 0.2500 mg | SUBCUTANEOUS | Status: AC

## 2023-10-11 NOTE — Patient Instructions (Addendum)
 Thank you for visiting [facility] today! Here's what we talked about: - Weight management will call you - Pick up Wegovy  0.25mg  from pharmacy

## 2023-10-11 NOTE — Progress Notes (Signed)
 Subjective:    Patient ID: Gregory [name], male    DOB: 10-02-1970, 53 y.o.   MRN: [id number]   Gregory [name] is a very pleasant 53 y.o. male who presents today for chronic conditions. Wants to reduce wegovy  0.25mg , walks 1-3 miles 3to 4 days/wk, hikes a couple days a month. Cut out dairy, cut out processed foods/sugary foods Exertional sob, light headedness, no palpitations, no other cardiac symptoms Says he is compliant with his Synthroid , takes it every day at least an hour before any food or any other medications.   Review of Systems  All other systems reviewed and are negative.       Allergies  Allergen Reactions   Sulfasalazine Anaphylaxis and Rash   Gluten Meal    Sulfa Antibiotics     Current Outpatient Medications on File Prior to Visit  Medication Sig Dispense Refill   ALPRAZolam  (XANAX ) 0.25 MG tablet Take 1 tablet (0.25 mg total) by mouth 2 (two) times daily as needed. 30 tablet 0   escitalopram  (LEXAPRO ) 10 MG tablet TAKE 1 TABLET BY MOUTH EVERY DAY 90 tablet 3   fluticasone (FLONASE) 50 MCG/ACT nasal spray Place 2 sprays into both nostrils daily.     levothyroxine  (SYNTHROID ) 112 MCG tablet Take 1 tablet (112 mcg total) by mouth daily. 90 tablet 3   Melatonin 3 MG CAPS Take 1 capsule by mouth daily as needed.     Probiotic Product (PROBIOTIC PO) Take by mouth.     triamterene -hydrochlorothiazide (MAXZIDE-25) 37.5-25 MG tablet Take 1 tablet by mouth daily. 90 tablet 3   No current facility-administered medications on file prior to visit.    BP 122/80 (BP Location: Left Arm, Patient Position: Sitting, Cuff Size: Large)   Pulse 68   Temp 98 F (36.7 C) (Oral)   Ht 5' 10.5 (1.791 m)   Wt 200 lb (90.7 kg)   SpO2 98%   BMI 28.29 kg/m   Objective:    Physical Exam Vitals and nursing note reviewed.  Constitutional:      Appearance: Normal appearance.  HENT:     Head: Normocephalic and atraumatic.  Eyes:     Extraocular Movements: Extraocular  movements intact.     Conjunctiva/sclera: Conjunctivae normal.  Skin:    General: Skin is warm.  Neurological:     Mental Status: He is alert.  Psychiatric:        Mood and Affect: Mood normal.        Behavior: Behavior normal.            Assessment & Plan:   1. Obesity (BMI 30.0-34.9) (Primary) 2. Prediabetes Patient has been on Wegovy  0.5 mg weekly, seeing good weight loss, per chart review he has lost more than 40 pounds over the past 5 months.  Patient would like to de-escalate to 0.25 mg weekly, extensively counseled patient about the risks which include: The fact that this is not a weight loss effective dose and possibility for weight gain.  Patient verbalizes understanding and opts to proceed.  Will send weight management referral for more robust diet/lifestyle support given this dose de-escalation. Per chart review, patient has been prediabetic from several months ago, will repeat lab for monitoring as he is due.  - Amb Ref to Medical Weight Management - semaglutide -weight management (WEGOVY ) 0.25 MG/0.5ML SOAJ SQ injection; Inject 0.25 mg into the skin once a week for 4 doses. - Hemoglobin A1c  3. Hypothyroidism, adult Symptoms well-controlled on current dose of Synthroid  as below, TSH  in January showed low TSH at 0.33, at that time, levothyroxine  dose was reduced to 112mcg which is his current dose, will repeat TSH at this time to see if further augmentation is needed, especially given significant weight loss.  - TSH - Continue Synthroid  112 mcg daily  4. SOB (shortness of breath) on exertion Has persisted for several months now, there is some associated lightheadedness but no other cardiac symptoms.  Cardiorespiratory exam is benign, will refer to cardiology for consideration of stress test as this could be an anginal equivalent.  - Ambulatory referral to Cardiology  5. Anxiety Of note, patient has a history of flight anxiety since 9/11, has been using Xanax  1 to 2  tablets 30 minutes prior to departure flights and return flights.  Counseled patient about the risks of benzodiazepine use, he verbalized understanding, through process of shared decision making he agrees to have short-term prescriptions written for flights as and when needed rather than 30-day supply.  Return in about 4 months (around 02/11/2024) for CPE.   Robbye Dede K Quintessa Simmerman, MD  10/11/23

## 2023-10-18 ENCOUNTER — Telehealth: Payer: Self-pay

## 2023-10-18 NOTE — Telephone Encounter (Signed)
 Copied from CRM [id number]. Topic: Clinical - Medication Question >> Oct 18, 2023 12:58 PM Paige D wrote: Reason for CRM: Pt called wanting to know if he should take his thyroid  medication the morning of re checking thyroid  labs? Please call pt to let him know if he is to take his meds the morning of or not.  Labs are scheduled 12/1. Pt states you can reach out via [employer]

## 2023-11-07 ENCOUNTER — Ambulatory Visit: Admitting: Cardiology

## 2023-11-07 NOTE — Progress Notes (Deleted)
  Cardiology Office Note:  .   Date:  11/07/2023  ID:  Gregory [name], DOB [birth year], MRN [id number] PCP: Bennett Reuben POUR, MD  [facility] Providers Cardiologist:  None { Click to update primary MD,subspecialty MD or APP then REFRESH:1}    No chief complaint on file.   Patient Profile: .     Gregory [name] is a (previously obese) 53 y.o. male  with a PMH notable for hypertension hypothyroidism and recent obesity.  Who presents here for evaluation of Shortness of Breath on Exertion at the request of Bowa, Reuben POUR, MD.  He has lost over 40 pounds on Wegovy .     Gregory [name] was seen by Dr. Bennett October 3 for routine follow-up to discuss obesity and other risk factors.  They were discussing Wegovy .  He also noted that he was walking 1 to 3 miles 3 to 4 days a week and hiking couple days a month.  Noted some exertional dyspnea and lightheadedness.  Referred to cardiology.  Subjective  Discussed the use of AI scribe software for clinical note transcription with the patient, who gave verbal consent to proceed.  History of Present Illness   Cardiovascular ROS: {roscv:310661}  ROS:  Review of Systems - {ros master:310782}    Objective   No outpatient medications have been marked as taking for the 11/07/23 encounter (Appointment) with Anner Alm ORN, MD.    Studies Reviewed: .        Results  ECHO: *** CATH: *** MONITOR: *** CT: ***  Risk Assessment/Calculations:   {Does this patient have ATRIAL FIBRILLATION?[phone]} No BP recorded.  {Refresh Note OR Click here to enter BP  :1}***         Physical Exam:   VS:  There were no vitals taken for this visit.   Wt Readings from Last 3 Encounters:  10/11/23 200 lb (90.7 kg)  05/30/23 239 lb (108.4 kg)  05/14/23 243 lb (110.2 kg)    Physical Exam    GEN: Well nourished, well developed in no acute distress; *** NECK: No JVD; No carotid bruits CARDIAC: Normal S1, S2; RRR, no murmurs, rubs,  gallops RESPIRATORY:  Clear to auscultation without rales, wheezing or rhonchi ; nonlabored, good air movement. ABDOMEN: Soft, non-tender, non-distended EXTREMITIES:  No edema; No deformity      ASSESSMENT AND PLAN: .    Problem List Items Addressed This Visit   None   Assessment and Plan Assessment & Plan        {Are you ordering a CV Procedure (e.g. stress test, cath, DCCV, TEE, etc)?   Press F2        :[id number]}   Follow-Up: No follow-ups on file.  I spent *** minutes in the care of Ezri Landers today including {CHL AMB CAR Time Based Billing Options STW (Optional):[phone]::documenting in the encounter.}      Signed, Alm MICAEL Anner, MD, MS Alm Anner, M.D., M.S. Interventional Cardiologist  [facility] Pager # [phone]

## 2023-11-28 ENCOUNTER — Ambulatory Visit: Admitting: Cardiology

## 2023-12-02 ENCOUNTER — Encounter: Payer: Self-pay | Admitting: Dietician

## 2023-12-02 ENCOUNTER — Encounter: Attending: Gynecology | Admitting: Dietician

## 2023-12-02 VITALS — Ht 71.0 in | Wt 199.7 lb

## 2023-12-02 DIAGNOSIS — R7303 Prediabetes: Secondary | ICD-10-CM | POA: Diagnosis not present

## 2023-12-02 DIAGNOSIS — Z6827 Body mass index (BMI) 27.0-27.9, adult: Secondary | ICD-10-CM | POA: Diagnosis not present

## 2023-12-02 DIAGNOSIS — Z713 Dietary counseling and surveillance: Secondary | ICD-10-CM | POA: Diagnosis present

## 2023-12-02 DIAGNOSIS — E66811 Obesity, class 1: Secondary | ICD-10-CM | POA: Diagnosis present

## 2023-12-02 DIAGNOSIS — I10 Essential (primary) hypertension: Secondary | ICD-10-CM

## 2023-12-02 DIAGNOSIS — G4733 Obstructive sleep apnea (adult) (pediatric): Secondary | ICD-10-CM

## 2023-12-02 NOTE — Patient Instructions (Addendum)
 Great job making healthy food choices and practicing moderation!  If weaning off GLP-1, make sure to continue with carb-controlled, high fiber, moderate healthy fat choices. Continue eating a meal or snack every 3-5 hours during the day.  Check www.oldwayspt.org for Mediterranean and other heritage diet resources and recipes.

## 2023-12-02 NOTE — Progress Notes (Signed)
 Medical Nutrition Therapy: Visit start time: 0930  end time: 1030  Assessment:   Referral Diagnosis: obesity Other medical history/ diagnoses: HTN, OSA, hypothyroidism, prediabetes Psychosocial issues/ stress concerns: none  Medications, supplements: reconciled list in medical record   Current weight: 199.7lbs with sweater and shoes Height: 5'11 BMI: 27.85 Patient's personal weight goal: 190-195lbs  Progress and evaluation:  Patient reports historically high activity level until ankle injury during marathon 12 years ago and gradually gained weight due to decreased activity as well as young children at home Highest weight 253lbs, started GLP1 at 234lbs; currently at plateau, but feels comfortable at current weight.  Wife cooks healthy foods, stays active by walking  Reduced gluten and dairy in the past year due to daughter's sensitivities Reduced alcohol intake to just occasional drink, after wife stopped alcohol due to health concerns. Food allergies: no allergies; reports GI symptoms with large amounts of dairy or gluten containing foods Patient seeks help with best diet practices for overall health and to maintain weight loss.   Dietary Intake:  Usual eating pattern includes 2-3 meals and 0-1 snacks per day. Dining out frequency: several meals per week. Who plans meals/ buys groceries? Self, spouse Who prepares meals? Self, spouse  Breakfast: 1/2 - 1 protein bar; leftover meat ie chicken; often skips Snack: none Lunch: salad; deli sandwich with 1/2 bread or ezekiel bread + carrots/ cucumber strips Snack: cyrstallized ginger candy (small portion)  Supper: 6-7pm grilled chicken/ fish/ steak/ some meatless + veg + rice/ quinoa/ potato; occ pizza but reduced portion to 2 slices; eats q/2 bun and often 1/2 meat when eating burger + no fries or salad Snack: none Beverages: water, black coffee with sm amt half n half; unsweetened iced tea  Physical activity: walking   Intervention:    Nutrition Care Education:   Basic nutrition: appropriate nutrient balance; appropriate meal and snack schedule; general nutrition guidelines; Mediterranean eating pattern for overall health value and maintaining weight loss while controlling hunger.    Weight control: making low sugar and low fat choices lifelong habits; portion control; managing appetite and hunger with inclusion of high fiber foods, adequate protein, and healthy fats; goals for physical activity; potential weaning off weight loss medication and preventing weight regain  Other intervention notes: Patient and family have been working on healthy changes together and are motivated to continue. No additional changes needed at this time; patient seems to be following a healthy eating pattern and is engaging in physical activity on a regular basis    Nutritional Diagnosis:  [place]-3.3 Overweight/obesity As related to history of obesity due to excess calories and limited physical activity.  As evidenced by patient at current BMI of 27.85 following weight loss of about 50lbs with lifestyle changes and GLP-1 medication.   Education Materials given:  Visit summary with goals/ instructions   Learner/ who was taught:  Patient   Level of understanding: Verbalizes/ demonstrates competency  Demonstrated degree of understanding via:   Teach back Learning barriers: None  Willingness to learn/ readiness for change: Eager, change in progress  Monitoring and Evaluation:  Dietary intake, exercise, and body weight      follow up: prn

## 2023-12-09 ENCOUNTER — Other Ambulatory Visit (INDEPENDENT_AMBULATORY_CARE_PROVIDER_SITE_OTHER)

## 2023-12-09 DIAGNOSIS — E039 Hypothyroidism, unspecified: Secondary | ICD-10-CM | POA: Diagnosis not present

## 2023-12-09 LAB — TSH: TSH: 0.19 u[IU]/mL — ABNORMAL LOW (ref 0.35–5.50)

## 2023-12-12 ENCOUNTER — Ambulatory Visit: Payer: Self-pay

## 2023-12-12 ENCOUNTER — Ambulatory Visit: Attending: Cardiology | Admitting: Cardiology

## 2023-12-12 ENCOUNTER — Encounter: Payer: Self-pay | Admitting: Cardiology

## 2023-12-12 VITALS — BP 102/80 | HR 62 | Ht 70.5 in | Wt 197.2 lb

## 2023-12-12 DIAGNOSIS — I10 Essential (primary) hypertension: Secondary | ICD-10-CM

## 2023-12-12 DIAGNOSIS — R0609 Other forms of dyspnea: Secondary | ICD-10-CM | POA: Insufficient documentation

## 2023-12-12 DIAGNOSIS — E66811 Obesity, class 1: Secondary | ICD-10-CM

## 2023-12-12 DIAGNOSIS — G4733 Obstructive sleep apnea (adult) (pediatric): Secondary | ICD-10-CM | POA: Diagnosis not present

## 2023-12-12 DIAGNOSIS — I2510 Atherosclerotic heart disease of native coronary artery without angina pectoris: Secondary | ICD-10-CM

## 2023-12-12 DIAGNOSIS — E039 Hypothyroidism, unspecified: Secondary | ICD-10-CM

## 2023-12-12 DIAGNOSIS — R7303 Prediabetes: Secondary | ICD-10-CM

## 2023-12-12 DIAGNOSIS — U099 Post covid-19 condition, unspecified: Secondary | ICD-10-CM

## 2023-12-12 MED ORDER — LEVOTHYROXINE SODIUM 88 MCG PO TABS: 88.0000 ug | ORAL_TABLET | Freq: Every day | ORAL | 3 refills | Status: AC

## 2023-12-12 NOTE — Patient Instructions (Signed)
 Medication Instructions:   Your physician recommends that you continue on your current medications as directed. Please refer to the Current Medication list given to you today.    *If you need a refill on your cardiac medications before your next appointment, please call your pharmacy*  Lab Work:  None ordered at this time   If you have labs (blood work) drawn today and your tests are completely normal, you will receive your results only by:  MyChart Message (if you have MyChart) OR  A paper copy in the mail If you have any lab test that is abnormal or we need to change your treatment, we will call you to review the results.  Testing/Procedures:  Your physician has requested that you have an echocardiogram. Echocardiography is a painless test that uses sound waves to create images of your heart. It provides your doctor with information about the size and shape of your heart and how well your heart's chambers and valves are working.   You may receive an ultrasound enhancing agent through an IV if needed to better visualize your heart during the echo. This procedure takes approximately one hour.  There are no restrictions for this procedure.  This will take place at [address] [facility] [address]) [address], [place] [address]  Please note: We ask at that you not bring children with you during ultrasound (echo/ vascular) testing. Due to room size and safety concerns, children are not allowed in the ultrasound rooms during exams. Our front office staff cannot provide observation of children in our lobby area while testing is being conducted. An adult accompanying a patient to their appointment will only be allowed in the ultrasound room at the discretion of the ultrasound technician under special circumstances. We apologize for any inconvenience.   Your physician has recommended that you have CT Coronary Calcium Score.  - $99 out of pocket cost at the time of your test - Call [phone] to schedule at your convenience.  [facility] [address] [place], [place] [address]   Coronary CalciumScan A coronary calcium scan is an imaging test used to look for deposits of calcium and other fatty materials (plaques) in the inner lining of the blood vessels of the heart (coronary arteries). These deposits of calcium and plaques can partly clog and narrow the coronary arteries without producing any symptoms or warning signs. This puts a person at risk for a heart attack. This test can detect these deposits before symptoms develop. Tell a health care provider about: Any allergies you have. All medicines you are taking, including vitamins, herbs, eye drops, creams, and over-the-counter medicines. Any problems you or family members have had with anesthetic medicines. Any blood disorders you have. Any surgeries you have had. Any medical conditions you have. Whether you are pregnant or may be pregnant. What are the risks? Generally, this is a safe procedure. However, problems may occur, including: Harm to a pregnant woman and her unborn baby. This test involves the use of radiation. Radiation exposure can be dangerous to a pregnant woman and her unborn baby. If you are pregnant, you generally should not have this procedure done. Slight increase in the risk of cancer. This is because of the radiation involved in the test. What happens before the procedure? No preparation is needed for this procedure. What happens during the procedure? You will undress and remove any jewelry around your neck or chest. You will put on a hospital gown. Sticky  electrodes will be placed on your chest. The electrodes will be connected to an electrocardiogram (ECG) machine to record a tracing of the electrical activity of your heart. A CT scanner will take pictures of your heart. During this time, you will be asked to lie still and hold your breath for 2-3  seconds while a picture of your heart is being taken. The procedure may vary among health care providers and hospitals. What happens after the procedure? You can get dressed. You can return to your normal activities. It is up to you to get the results of your test. Ask your health care provider, or the department that is doing the test, when your results will be ready. Summary A coronary calcium scan is an imaging test used to look for deposits of calcium and other fatty materials (plaques) in the inner lining of the blood vessels of the heart (coronary arteries). Generally, this is a safe procedure. Tell your health care provider if you are pregnant or may be pregnant. No preparation is needed for this procedure. A CT scanner will take pictures of your heart. You can return to your normal activities after the scan is done. This information is not intended to replace advice given to you by your health care provider. Make sure you discuss any questions you have with your health care provider. Document Released: 06/23/2007 Document Revised: 11/14/2015 Document Reviewed: 11/14/2015 Elsevier Interactive Patient Education  2017 [employer].   Referrals:  None ordered at this time   Follow-Up:  At [facility], you and your health needs are our priority.  As part of our continuing mission to provide you with exceptional heart care, our providers are all part of one team.  This team includes your primary Cardiologist (physician) and Advanced Practice Providers or APPs (Physician Assistants and Nurse Practitioners) who all work together to provide you with the care you need, when you need it.  Your next appointment:   2 - 3 month(s)  Provider:    Alm Clay, MD    We recommend signing up for the patient portal called MyChart.  Sign up information is provided on this After Visit Summary.  MyChart is used to connect with patients for Virtual Visits (Telemedicine).  Patients are  able to view lab/test results, encounter notes, upcoming appointments, etc.  Non-urgent messages can be sent to your provider as well.   To learn more about what you can do with MyChart, go to [email or web address].

## 2023-12-12 NOTE — Assessment & Plan Note (Signed)
 Monitored due to family history and borderline status.

## 2023-12-12 NOTE — Assessment & Plan Note (Signed)
 Chronic dyspnea initially thought to be post-COVID-19 related.  Differential includes cardiac effects from COVID-19 versus potentially ischemia. - Ordered Echocardiogram to assess heart function and valve status. - Ordered Coronary Calcium Score to evaluate for cholesterol plaque. - Scheduled follow-up after test results are available.  Depending on the results of these above studies, could consider ischemic evaluation versus just continuation of medical management.

## 2023-12-12 NOTE — Assessment & Plan Note (Signed)
 Continue CPAP.

## 2023-12-12 NOTE — Progress Notes (Signed)
 Cardiology Office Note:  .   Date:  12/12/2023  ID:  Gregory [name], DOB [birth year], MRN [id number] PCP: Bennett Reuben POUR, MD  [facility] Providers Cardiologist:  None     Chief Complaint  Patient presents with   Shortness of Breath    Had Covid twice. Patient states that at time he feels short of breath and experiences fatigue on exertion. Patient states that he sometimes does not have stamina. He feels like sometimes he has to stop what he is doing and catch his breath. Patient is feeling ok right now. Meds reviewed.     Patient Profile: .     Gregory [name] is a 53 y.o. male with a PMH notable for former obesity, h/o Long-Covid, HTN who presents here for baseline cardiology evaluation with complaints of exertional dyspnea.  Referred at the request of Bowa, Reuben POUR, MD.    Gregory [name] was seen by Dr. Bennett on October 3.  In this note she indicated that he is a healthy gentleman asking to reduce his Wegovy  dose.  He was walking 1 to 3 miles 3 to 4 days a week and hiking couple days a month.  He had lost weight with the Wegovy  and adjusting his food.  Noted some occasional dyspnea on exertion but otherwise no symptoms.  His Synthroid  dose is also being adjusted.  Because of the persistent exertional dyspnea over several months, he is referred to cardiology for possible stress test as it could be an anginal equivalent.  Subjective  Discussed the use of AI scribe software for clinical note transcription with the patient, who gave verbal consent to proceed.  History of Present Illness Gregory [name] is a 53 year old male who presents for a baseline cardiology evaluation. Referred by his new primary care doctor for a baseline cardiology evaluation due to past severe COVID-19 episodes.  He experienced severe episodes of COVID-19, with the most recent significant episode occurring at the beginning of 2024. During this time, he was bedridden for several weeks and required  short-term disability leave for three months due to extreme fatigue and dyspnea. He was treated with Paxlovid  during his last COVID-19 episode and received monoclonal antibody injections in 2021.  He has a history of weight gain during the COVID-19 pandemic, reaching a maximum weight of 253 pounds. Since then, he has been on Wegovy  for weight loss, reducing his weight to approximately 195 pounds. He has a history of borderline diabetes and is currently on triamterene  for previously high blood pressure.  He experiences occasional shortness of breath during physical activities such as yard work. He also reports intermittent sensations of 'electrical quivers' in his chest lasting a few seconds. No persistent chest tightness or pressure during exertion.  He maintains an active lifestyle, participating in walking contests and regularly walking 1-2 miles several times a week. Walking on trails is more comfortable than on roads due to orthopedic issues with his ankles.  Current medications include Wegovy  for weight loss, Synthroid  for hypothyroidism, Lexapro , and Flonase. He also uses medication for flying-related anxiety. He has a family history of diabetes; his father has diabetes, and his paternal grandmother died from diabetes complications. His mother underwent a mitral valve repair a few years ago.     Objective   Past Medical History:  Diagnosis Date   Hypertension    Hypothyroidism, adult    Mood disorder    Non-celiac gluten sensitivity    Obstructive sleep apnea    Better with CPAP  Osteoarthritis of multiple joints    knees and ankles   Past Surgical History:  Procedure Laterality Date   NO PAST SURGERIES     Current Meds  Medication Sig   ALPRAZolam  (XANAX ) 0.25 MG tablet Take 1 tablet (0.25 mg total) by mouth 2 (two) times daily as needed.   Cyanocobalamin (VITAMIN B-12 CR PO) Take by mouth.   escitalopram  (LEXAPRO ) 10 MG tablet TAKE 1 TABLET BY MOUTH EVERY DAY   fluticasone  (FLONASE) 50 MCG/ACT nasal spray Place 2 sprays into both nostrils daily.   levothyroxine  (SYNTHROID ) 88 MCG tablet Take 1 tablet (88 mcg total) by mouth daily.   Melatonin 3 MG CAPS Take 1 capsule by mouth daily as needed. (Patient taking differently: Take 1 capsule by mouth daily as needed. May take two tablets at night as needed)   Probiotic Product (PROBIOTIC PO) Take by mouth.   triamterene -hydrochlorothiazide (MAXZIDE-25) 37.5-25 MG tablet Take 1 tablet by mouth daily.   VITAMIN D, CHOLECALCIFEROL, PO Take by mouth.   WEGOVY  0.5 MG/0.5ML SOAJ SQ injection Inject 0.5 mg into the skin once a week.   Social History   Tobacco Use   Smoking status: Former    Types: Cigars    Passive exposure: Past   Smokeless tobacco: Never   Tobacco comments:    1 every 1-2 months  Vaping Use   Vaping status: Some Days  Substance Use Topics   Alcohol use: Yes    Comment: rarely 1 drink   Drug use: No    Family History Family History includes Breast cancer in his mother; Colon cancer in his paternal uncle; Diabetes in his father and paternal grandmother; Heart disease in his mother; Obesity in his father; Prostate cancer in his maternal uncle.   Studies Reviewed: SABRA   EKG Interpretation Date/Time:  Thursday December 12 2023 15:15:00 EST Ventricular Rate:  62 PR Interval:  182 QRS Duration:  94 QT Interval:  402 QTC Calculation: 408 R Axis:   -10  Text Interpretation: Normal sinus rhythm with sinus arrhythmia Normal ECG No previous ECGs available Confirmed by Anner Lenis ([id number]) on 12/12/2023 3:40:22 PM    Results   Lab Results  Component Value Date   NA 141 01/15/2023   K 3.7 01/15/2023   CREATININE 1.00 01/15/2023   GFR 87.32 01/15/2023   GLUCOSE 117 (H) 01/15/2023   Lab Results  Component Value Date   CHOL 226 (H) 01/15/2023   HDL 52.90 01/15/2023   LDLCALC 146 (H) 01/15/2023   LDLDIRECT 86.0 01/18/2022   TRIG 135.0 01/15/2023   CHOLHDL 4 01/15/2023   Lab Results   Component Value Date   HGBA1C 5.3 10/11/2023    Risk Assessment/Calculations:               Physical Exam:   VS:  BP 102/80   Pulse 62   Ht 5' 10.5 (1.791 m)   Wt 197 lb 3.2 oz (89.4 kg)   SpO2 98%   BMI 27.90 kg/m    Wt Readings from Last 3 Encounters:  12/12/23 197 lb 3.2 oz (89.4 kg)  12/02/23 199 lb 11.2 oz (90.6 kg)  10/11/23 200 lb (90.7 kg)    Physical Exam MEASUREMENTS: Weight- mid 195. PULMONARY: Lungs are clear to auscultation bilaterally.   GEN: Well nourished, well developed in no acute distress; healthy appearing.  Mildly obese. NECK: No JVD; No carotid bruits CARDIAC: Normal S1, S2; RRR, no murmurs, rubs, gallops RESPIRATORY:  Clear to auscultation without rales,  wheezing or rhonchi ; nonlabored, good air movement. ABDOMEN: Soft, non-tender, non-distended EXTREMITIES:  No edema; No deformity      ASSESSMENT AND PLAN: .    Problem List Items Addressed This Visit       Cardiology Problems   Hypertension - Primary (Chronic)   BP well-controlled on Maxide.  I suspect that with his weight loss from Wegovy  that he may be out of back down on meds.      Relevant Orders   EKG 12-Lead (Completed)     Other   Obesity (BMI 30.0-34.9) (Chronic)   Obesity with significant weight loss. Currently on Wegovy . - Continue Wegovy  for weight management.      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  Differential includes cardiac effects from COVID-19 versus potentially ischemia. - Ordered Echocardiogram to assess heart function and valve status. - Ordered Coronary Calcium Score to evaluate for cholesterol plaque. - Scheduled follow-up after test results are available.  Depending on the results of these above studies, could consider ischemic evaluation versus just continuation of medical management.       Prediabetes   Monitored due to family history and borderline status.      Other Visit Diagnoses       Dyspnea on exertion       Relevant Orders   ECHOCARDIOGRAM COMPLETE     Coronary artery disease involving native coronary artery of native heart without angina pectoris       Relevant Orders   CT CARDIAC SCORING (SELF PAY ONLY)          Follow-Up: Return in about 2 months (around 02/12/2024) for Routine Follow-up after testing ~ 1-2 months, [place] office, To discuss test results.     Signed, Alm MICAEL Clay, MD, MS Alm Clay, M.D., M.S. Interventional Cardiologist  [facility] Pager # [phone]

## 2023-12-12 NOTE — Assessment & Plan Note (Signed)
 Obesity with significant weight loss. Currently on Wegovy . - Continue Wegovy  for weight management.

## 2023-12-12 NOTE — Assessment & Plan Note (Signed)
 BP well-controlled on Maxide.  I suspect that with his weight loss from Wegovy  that he may be out of back down on meds.

## 2023-12-16 ENCOUNTER — Other Ambulatory Visit (HOSPITAL_COMMUNITY): Payer: Self-pay

## 2023-12-17 ENCOUNTER — Telehealth: Payer: Self-pay

## 2023-12-17 ENCOUNTER — Other Ambulatory Visit (HOSPITAL_COMMUNITY): Payer: Self-pay

## 2023-12-17 NOTE — Telephone Encounter (Signed)
 Pharmacy Patient Advocate Encounter  Received notification from [facility] that Prior Authorization for Wegovy  0.5 has been APPROVED from 12/17/23 to 12/16/24. Unable to obtain price due to refill too soon rejection, last fill date 11/12/23 next available fill date1/10/26   PA #/Case ID/Reference #: # [id number]

## 2023-12-17 NOTE — Telephone Encounter (Signed)
 Pharmacy Patient Advocate Encounter   Received notification from Onbase that prior authorization for Wegovy  0.5 is required/requested.   Insurance verification completed.   The patient is insured through [facility].   Per test claim: PA required; PA submitted to above mentioned insurance via Latent Key/confirmation #/EOC [id number] Status is pending

## 2024-01-10 ENCOUNTER — Ambulatory Visit: Attending: Cardiology

## 2024-01-10 DIAGNOSIS — R0609 Other forms of dyspnea: Secondary | ICD-10-CM | POA: Diagnosis not present

## 2024-01-10 LAB — ECHOCARDIOGRAM COMPLETE
AR max vel: 4.09 cm2
AV Area VTI: 3.8 cm2
AV Area mean vel: 3.77 cm2
AV Mean grad: 2 mmHg
AV Peak grad: 3.7 mmHg
Ao pk vel: 0.96 m/s
Area-P 1/2: 4.6 cm2
Calc EF: 44.3 %
S' Lateral: 3.4 cm
Single Plane A2C EF: 44.4 %
Single Plane A4C EF: 46.7 %

## 2024-01-11 ENCOUNTER — Ambulatory Visit: Payer: Self-pay | Admitting: Cardiovascular Disease

## 2024-01-20 ENCOUNTER — Ambulatory Visit

## 2024-01-20 VITALS — BP 100/70 | HR 66 | Temp 98.0°F | Ht 70.5 in | Wt 198.0 lb

## 2024-01-20 DIAGNOSIS — G4733 Obstructive sleep apnea (adult) (pediatric): Secondary | ICD-10-CM

## 2024-01-20 DIAGNOSIS — Z6828 Body mass index (BMI) 28.0-28.9, adult: Secondary | ICD-10-CM

## 2024-01-20 DIAGNOSIS — Z113 Encounter for screening for infections with a predominantly sexual mode of transmission: Secondary | ICD-10-CM

## 2024-01-20 DIAGNOSIS — Z23 Encounter for immunization: Secondary | ICD-10-CM

## 2024-01-20 DIAGNOSIS — E7849 Other hyperlipidemia: Secondary | ICD-10-CM

## 2024-01-20 DIAGNOSIS — F419 Anxiety disorder, unspecified: Secondary | ICD-10-CM

## 2024-01-20 DIAGNOSIS — Z125 Encounter for screening for malignant neoplasm of prostate: Secondary | ICD-10-CM | POA: Diagnosis not present

## 2024-01-20 DIAGNOSIS — R7303 Prediabetes: Secondary | ICD-10-CM | POA: Diagnosis not present

## 2024-01-20 DIAGNOSIS — E66811 Obesity, class 1: Secondary | ICD-10-CM

## 2024-01-20 DIAGNOSIS — Z0000 Encounter for general adult medical examination without abnormal findings: Secondary | ICD-10-CM

## 2024-01-20 LAB — LIPID PANEL
Cholesterol: 215 mg/dL — ABNORMAL HIGH (ref 28–200)
HDL: 53.4 mg/dL
LDL Cholesterol: 143 mg/dL — ABNORMAL HIGH (ref 10–99)
NonHDL: 161.3
Total CHOL/HDL Ratio: 4
Triglycerides: 93 mg/dL (ref 10.0–149.0)
VLDL: 18.6 mg/dL (ref 0.0–40.0)

## 2024-01-20 LAB — HEMOGLOBIN A1C: Hgb A1c MFr Bld: 5.1 % (ref 4.6–6.5)

## 2024-01-20 LAB — PSA: PSA: 0.88 ng/mL (ref 0.10–4.00)

## 2024-01-20 MED ORDER — ALPRAZOLAM 0.25 MG PO TABS: 0.7500 mg | ORAL_TABLET | Freq: Every day | ORAL | 0 refills | Status: AC

## 2024-01-20 NOTE — Addendum Note (Signed)
 Addended by: KALLIE CLOTILDA SQUIBB on: 01/20/2024 09:19 AM   Modules accepted: Orders

## 2024-01-20 NOTE — Progress Notes (Signed)
 "  Assessment & Plan:   This visit was conducted in person. The patient gave informed consent to the use of [employer] AI technology to record the contents of the encounter as documented below.   Assessment & Plan General Health Maintenance Discussed immunizations and screenings; shingles and pneumonia vaccines recommended. - Schedule shingles vaccine at pharmacy. - Administered pneumonia vaccine today. - Schedule colonoscopy for April 2026. - Ordered PSA test today. - Ordered HIV test today.  Colonoscopy: In April 2023, showed precancerous polyps, 10yr repeat recommended Labs: PSA (family hx prostate ca), HIV, Lipid and A1c UTD Immunizations: Prevnar given, declined flu Diet and Exercise: Good, seeing nutrition, Walking Sleep and mood: Okay  Dental and vision: UTD Health counselling given: Continued healthy diet given hx of obesity   Obesity, class 1 Weight managed with Wegovy  0.5 mg, 60-pound loss over two years. He decided not to go down to 0.25mg  just yet but does eventually ant to discontinue. - Continue Wegovy  0.5 mg weekly. - Follow up in 8 weeks to assess weight management.  Prediabetes Monitoring A1c levels. - Ordered A1c test today.  Obstructive sleep apnea Weight loss may affect condition; CPAP mask now loose, refer for eval to see if still needed - Referred to pulmonology for evaluation and potential sleep study.  Other hyperlipidemia Monitoring cholesterol levels. - Ordered cholesterol test today.  Anxiety disorder Well controlled, Managed with escitalopram  with alprazolam  exclusively for flight anxiety, 6 tablets reordered for upcoming flight. - Continue escitalopram  10 mg daily. - Continue alprazolam  0.75 mg for flights as needed.         Follow-up: Return in about 8 weeks (around 03/16/2024) for Weight, then 31yr for CPE, fasting labs a week prior. Reschedule 1/19 visit to 2/6.        Subjective:   Patient ID: Gregory [name], male    DOB: 1970-05-28   Age: 54 y.o. MRN: [id number]  Patient Care Team: Bennett Reuben POUR, MD as PCP - General (Family Medicine)   CC:  Chief Complaint  Patient presents with   Annual Exam    Pt will be flying at the end of the month. Will need rx for alprazolam  for that flight.     Gregory [name] is a 54 y.o. male here for annual physical and f/u on chronic conditions, see assessment and plan for further details Acute complaint of OSA and anxiety also addressed, see assessment and plan for further details  Discussed the use of AI scribe software for clinical note transcription with the patient, who gave verbal consent to proceed.  History of Present Illness Gregory [name] is a 54 year old male who presents for a follow-up visit.  He has been on Wegovy  0.5 mg for weight management and has lost approximately 60 pounds over the past two years, with 30 pounds lost prior to starting Wegovy . His weight has been stable recently, fluctuating between 195 and 198 pounds.  He is currently on levothyroxine  88 mcg, taken on an empty stomach in the morning. He has a lab appointment for thyroid  function testing scheduled in two weeks.  He uses melatonin as needed, Flonase, Lexapro , B12, amoxicillin , Xanax , and vitamin D. He also takes triamterene  37.5 mg and hydrochlorothiazide 25 mg for hypertension, which has been well-controlled with recent readings around 100/70 mmHg. He has not taken his blood pressure medication today and usually takes it mid-morning.  He has a history of prediabetes and will have an A1c test today. He has a family history of prostate cancer  in his maternal uncle and colon cancer in another maternal uncle. His father is diabetic, and his mother has had breast cancer and valve repair surgery.  He has significantly reduced alcohol consumption, having less than ten drinks in the past year, and has quit smoking cigars. He follows a diet eliminating gluten and dairy due to his daughter's sensitivities and  engages in regular physical activity, aiming for 10,000 steps daily and using a treadmill.  He has a history of sleep apnea and uses a CPAP machine. With recent weight loss, he is considering a re-evaluation of his need for CPAP.  He had a colonoscopy in April 2023, which revealed six precancerous polyps, and he is due for a repeat in three years. He has a history of elevated cholesterol and will have it rechecked today.  He has not received the flu shot this year but has agreed to receive the pneumonia vaccine today.   Depression Screening;    01/20/2024    8:17 AM 12/02/2023    2:44 PM 10/11/2023    8:26 AM 05/14/2023    7:37 AM 01/15/2023    7:58 AM 04/17/2022    7:58 AM 02/12/2022    8:06 AM  PHQ 2/9 Scores  PHQ - 2 Score 0 0 0 0 0 0 6  PHQ- 9 Score       20      Data saved with a previous flowsheet row definition     Anxiety Screening:    04/17/2022    7:58 AM 02/12/2022    8:07 AM 02/02/2022    9:12 AM  GAD 7 : Generalized Anxiety Score  Nervous, Anxious, on Edge 1 3 3   Control/stop worrying 1 3 3   Worry too much - different things 0 3 3  Trouble relaxing 0 3 2  Restless 0 1 0  Easily annoyed or irritable 1 3 1   Afraid - awful might happen 0 2 1  Total GAD 7 Score 3 18 13   Anxiety Difficulty Somewhat difficult Very difficult Very difficult     ROS: Negative unless specifically indicated above in HPI.       Objective:     BP 100/70 (BP Location: Left Arm, Patient Position: Sitting, Cuff Size: Normal)   Pulse 66   Temp 98 F (36.7 C) (Oral)   Ht 5' 10.5 (1.791 m)   Wt 198 lb (89.8 kg)   SpO2 97%   BMI 28.01 kg/m    Physical Exam VITALS: BP- 100/70 MEASUREMENTS: Weight- 197. GENERAL: Alert, cooperative, well developed, no acute distress. HEAD: Normocephalic atraumatic. EYES: Extraocular movements intact BL, pupils round, equal and reactive to light BL, conjunctivae normal BL. EARS: Cerumen present. NOSE: No congestion or rhinorrhea, mucous membranes are  moist. THROAT: No oropharyngeal exudate or posterior oropharyngeal erythema, throat normal. CARDIOVASCULAR: Normal heart rate and rhythm, S1 and S2 normal without murmurs. CHEST: Clear to auscultation bilaterally, no wheezes, rhonchi, or crackles. ABDOMEN: Soft, non tender, non distended, without organomegaly, normal bowel sounds. EXTREMITIES: No cyanosis or edema. NEUROLOGICAL: Oriented to person, place and time, no gait abnormalities, moves all extremities without gross motor or sensory deficit. NECK: Supple, no tenderness.        Tamaiya Bump K Mahli Glahn, MD  01/20/2024    Contains text generated by [employer].    "

## 2024-01-20 NOTE — Patient Instructions (Addendum)
 Thank you for visiting [facility] today! Here's what we talked about: - START checking blood pressure daily  for 7d then send readings - STOP Maxzide  - Go get Shingles vaccine from pharmacy - Call [facility] to schedule Colonsocopy

## 2024-01-21 LAB — HIV ANTIBODY (ROUTINE TESTING W REFLEX)
HIV 1&2 Ab, 4th Generation: NONREACTIVE
HIV FINAL INTERPRETATION: NEGATIVE

## 2024-01-23 ENCOUNTER — Ambulatory Visit
Admission: RE | Admit: 2024-01-23 | Discharge: 2024-01-23 | Disposition: A | Discharge status: Home / Self Care | Payer: Self-pay | Source: Ambulatory Visit | Attending: Cardiology | Admitting: Cardiology

## 2024-01-23 DIAGNOSIS — I2510 Atherosclerotic heart disease of native coronary artery without angina pectoris: Secondary | ICD-10-CM | POA: Insufficient documentation

## 2024-01-24 NOTE — Progress Notes (Signed)
 Last read by Redell Currier at 11:59AM on 01/24/2024.

## 2024-01-26 ENCOUNTER — Ambulatory Visit: Payer: Self-pay

## 2024-01-27 ENCOUNTER — Other Ambulatory Visit

## 2024-02-17 IMAGING — CT CT RENAL STONE PROTOCOL
2 of 4 series · 16 of 46 positions shown, 18 images · non-contrast
Comparison: None Available.

CLINICAL DATA: Flank pain, kidney stone suspected



[Series 2: ap without · axial · non-contrast · 0.81mm/px · z∈[-976,-481]mm · 13 of 113 slices shown, 15 images]
[im 7/113  soft-tissue]
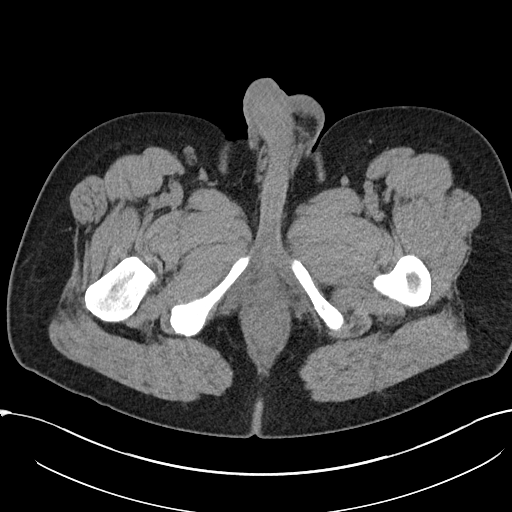
[im 7/113  bone]
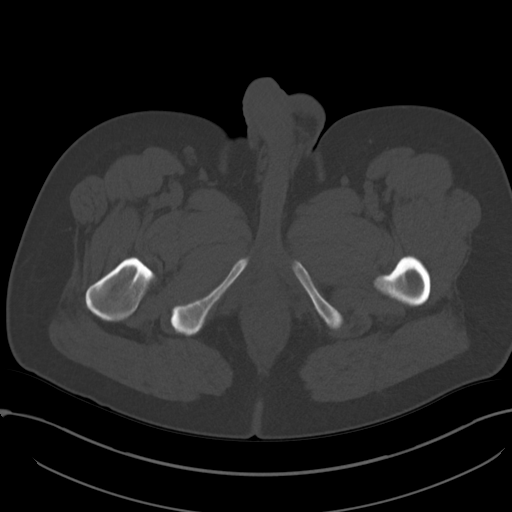
[im 14/113  soft-tissue]
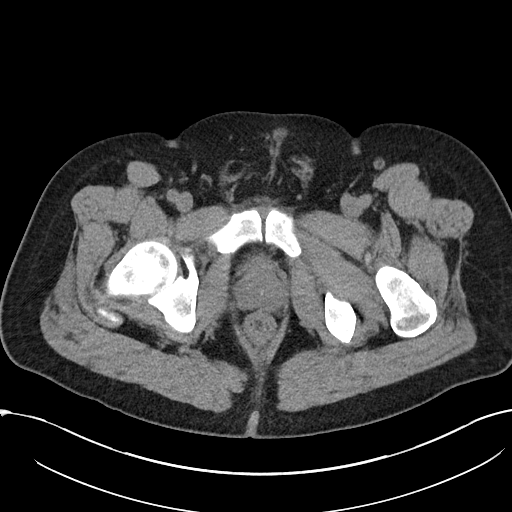
[im 27/113  soft-tissue]
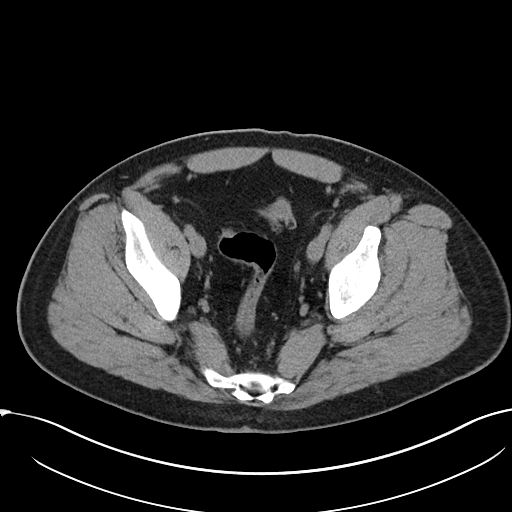
[im 33/113  soft-tissue]
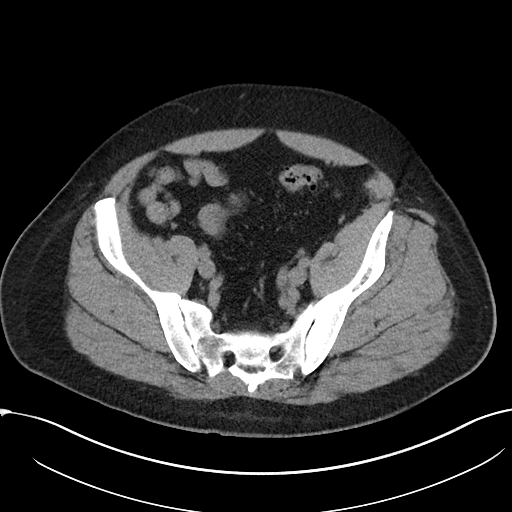
[im 40/113  soft-tissue]
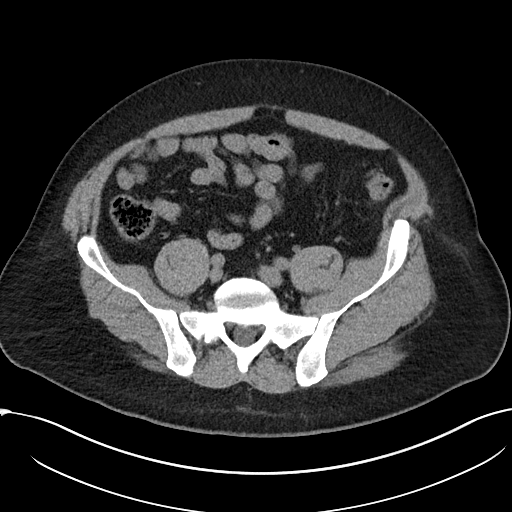
[im 47/113  soft-tissue]
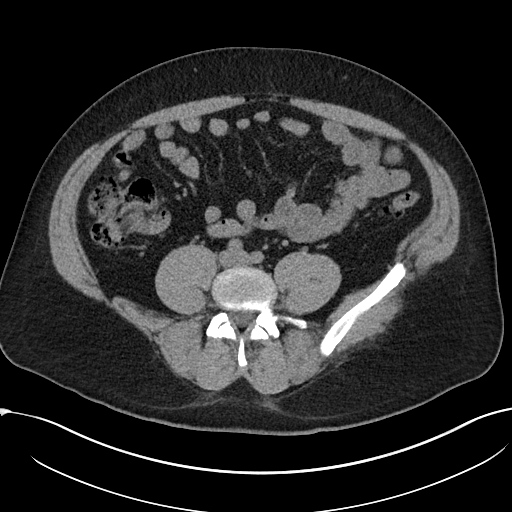
[im 60/113  soft-tissue]
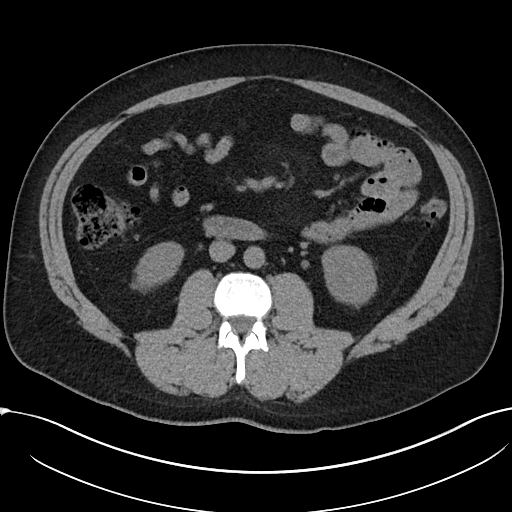
[im 66/113  soft-tissue]
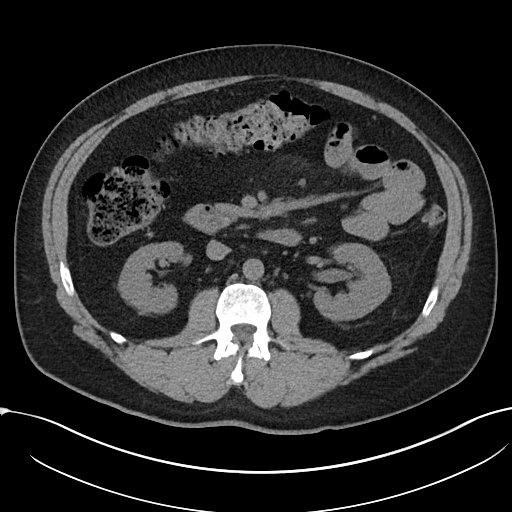
[im 73/113  soft-tissue]
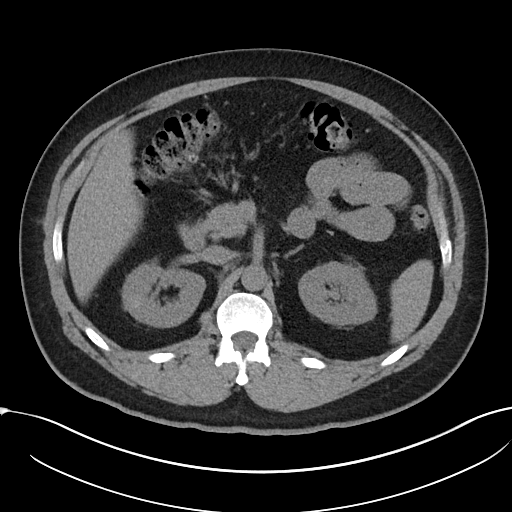
[im 73/113  bone]
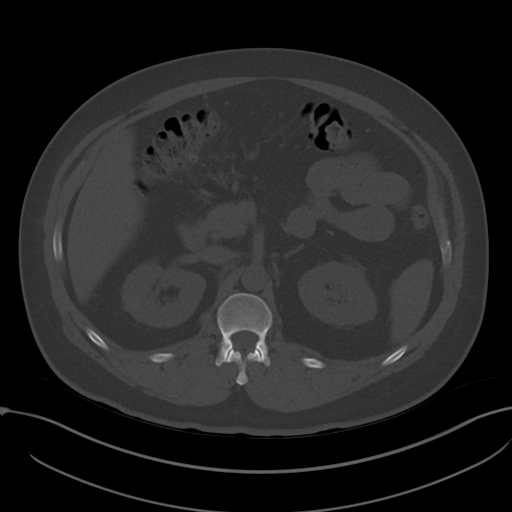
[im 80/113  soft-tissue]
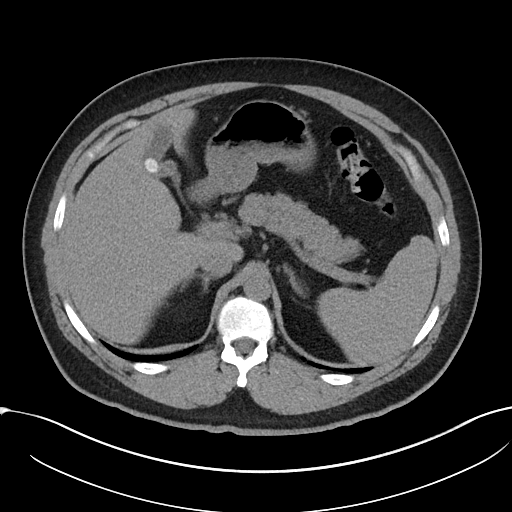
[im 86/113  soft-tissue]
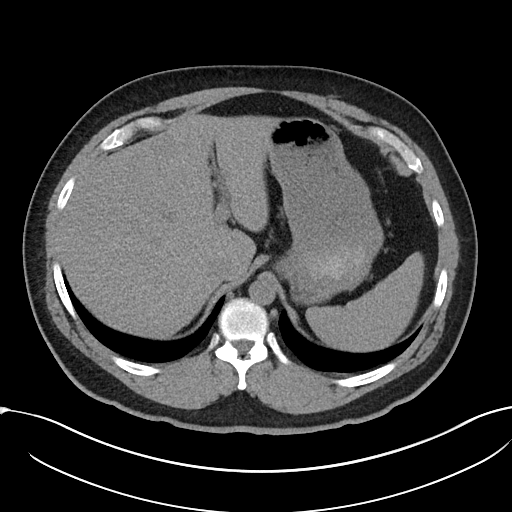
[im 99/113  soft-tissue]
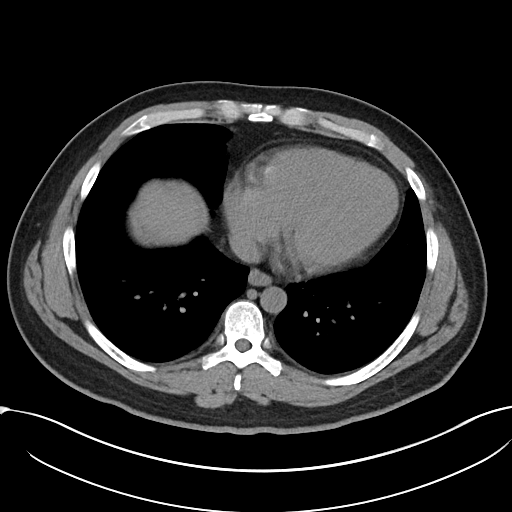
[im 106/113  soft-tissue]
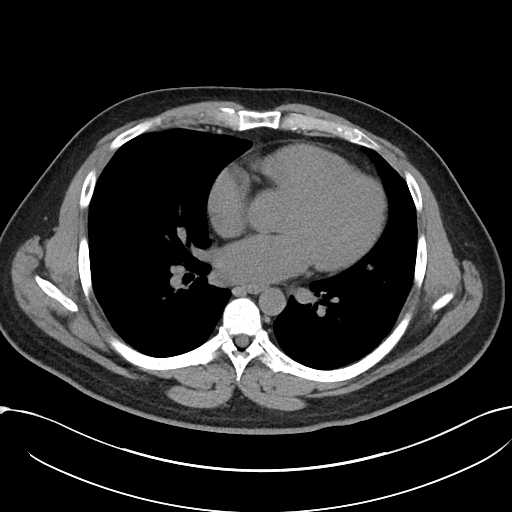

[Series 5: cor · coronal · 0.88mm/px · 3 of 108 slices shown]
[im 36/108  soft-tissue]
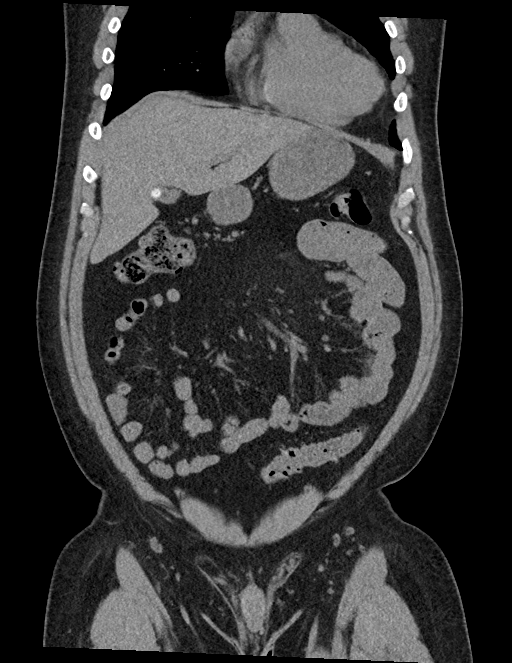
[im 48/108  soft-tissue]
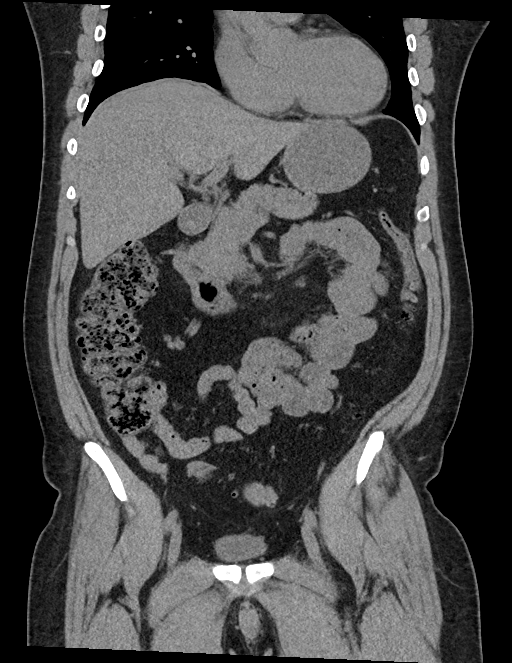
[im 60/108  soft-tissue]
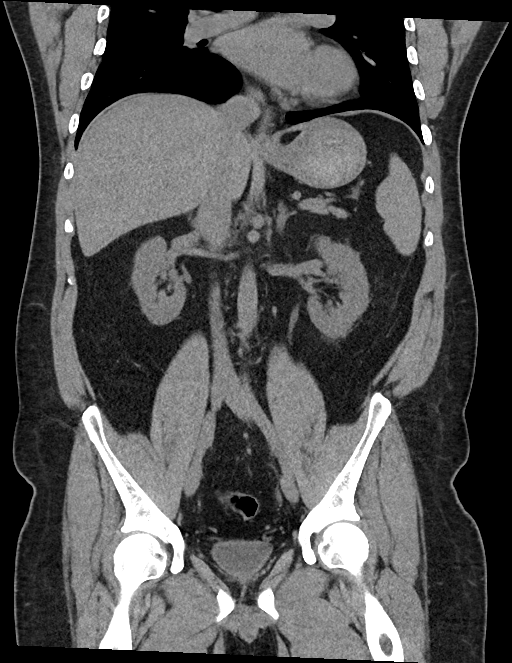

[16 of 46 positions shown; findings below may reference images not displayed]

FINDINGS: Lower chest: No acute findings

Hepatobiliary: 2.2 cm low-density area anteriorly in the liver right
hepatic lobe, difficult to characterize on this noncontrast study.
11 mm gallstone within the gallbladder. No biliary ductal
dilatation.

Pancreas: No focal abnormality or ductal dilatation.

Spleen: No focal abnormality.  Normal size.

Adrenals/Urinary Tract: Mild left hydronephrosis and hydroureter due
to punctate 1 mm distal left ureteral stone. No stones or
hydronephrosis on the right. Adrenal glands and urinary bladder
unremarkable.

Stomach/Bowel: Sigmoid diverticulosis. No active diverticulitis.
Stomach and small bowel decompressed, unremarkable. Normal appendix.

Vascular/Lymphatic: No evidence of aneurysm or adenopathy.

Reproductive: No visible focal abnormality.

Other: No free fluid or free air.

Musculoskeletal: No acute bony abnormality.
IMPRESSION: 1 mm distal left ureteral stone with mild left hydronephrosis.

Cholelithiasis.

2.2 cm low-density area anteriorly in the right hepatic lobe which
cannot be characterized on this noncontrast study. This could be
further evaluated with ultrasound.

## 2024-02-19 ENCOUNTER — Ambulatory Visit: Admitting: Sleep Medicine

## 2024-02-19 ENCOUNTER — Other Ambulatory Visit

## 2024-02-27 ENCOUNTER — Ambulatory Visit: Admitting: Cardiology

## 2024-03-16 ENCOUNTER — Ambulatory Visit

## 2025-01-13 ENCOUNTER — Other Ambulatory Visit

## 2025-01-20 ENCOUNTER — Encounter
